# Patient Record
Sex: Male | Born: 1990 | Race: White | Hispanic: No | Marital: Married | Smoking: Former smoker
Health system: Southern US, Community
[De-identification: ages and names within clinical notes are randomized; demographics above are authoritative.]

---

## 2019-04-17 DIAGNOSIS — R3129 Other microscopic hematuria: Secondary | ICD-10-CM | POA: Diagnosis not present

## 2019-04-17 DIAGNOSIS — K5900 Constipation, unspecified: Secondary | ICD-10-CM | POA: Diagnosis not present

## 2019-04-17 DIAGNOSIS — R399 Unspecified symptoms and signs involving the genitourinary system: Secondary | ICD-10-CM | POA: Diagnosis not present

## 2019-07-31 DIAGNOSIS — Z20828 Contact with and (suspected) exposure to other viral communicable diseases: Secondary | ICD-10-CM | POA: Diagnosis not present

## 2019-07-31 DIAGNOSIS — Z20822 Contact with and (suspected) exposure to covid-19: Secondary | ICD-10-CM | POA: Diagnosis not present

## 2019-12-21 DIAGNOSIS — Z20828 Contact with and (suspected) exposure to other viral communicable diseases: Secondary | ICD-10-CM | POA: Diagnosis not present

## 2019-12-21 DIAGNOSIS — Z20822 Contact with and (suspected) exposure to covid-19: Secondary | ICD-10-CM | POA: Diagnosis not present

## 2020-12-24 DIAGNOSIS — S0990XA Unspecified injury of head, initial encounter: Secondary | ICD-10-CM | POA: Diagnosis not present

## 2020-12-24 DIAGNOSIS — W182XXA Fall in (into) shower or empty bathtub, initial encounter: Secondary | ICD-10-CM | POA: Diagnosis not present

## 2021-02-17 DIAGNOSIS — F909 Attention-deficit hyperactivity disorder, unspecified type: Secondary | ICD-10-CM | POA: Diagnosis not present

## 2021-02-17 DIAGNOSIS — F411 Generalized anxiety disorder: Secondary | ICD-10-CM | POA: Diagnosis not present

## 2021-03-01 DIAGNOSIS — F909 Attention-deficit hyperactivity disorder, unspecified type: Secondary | ICD-10-CM | POA: Diagnosis not present

## 2021-03-01 DIAGNOSIS — F411 Generalized anxiety disorder: Secondary | ICD-10-CM | POA: Diagnosis not present

## 2021-04-01 DIAGNOSIS — F411 Generalized anxiety disorder: Secondary | ICD-10-CM | POA: Diagnosis not present

## 2021-04-01 DIAGNOSIS — F909 Attention-deficit hyperactivity disorder, unspecified type: Secondary | ICD-10-CM | POA: Diagnosis not present

## 2021-04-28 ENCOUNTER — Encounter (HOSPITAL_COMMUNITY): Payer: Self-pay

## 2021-04-28 ENCOUNTER — Ambulatory Visit (HOSPITAL_COMMUNITY)
Admission: EM | Admit: 2021-04-28 | Discharge: 2021-04-28 | Disposition: A | Discharge status: Home / Self Care | Payer: Federal, State, Local not specified - PPO | Attending: Physician Assistant | Admitting: Physician Assistant

## 2021-04-28 ENCOUNTER — Other Ambulatory Visit: Payer: Self-pay

## 2021-04-28 DIAGNOSIS — R350 Frequency of micturition: Secondary | ICD-10-CM | POA: Diagnosis not present

## 2021-04-28 DIAGNOSIS — R000 Tachycardia, unspecified: Secondary | ICD-10-CM | POA: Insufficient documentation

## 2021-04-28 DIAGNOSIS — R39198 Other difficulties with micturition: Secondary | ICD-10-CM | POA: Diagnosis not present

## 2021-04-28 DIAGNOSIS — N390 Urinary tract infection, site not specified: Secondary | ICD-10-CM | POA: Diagnosis not present

## 2021-04-28 DIAGNOSIS — R300 Dysuria: Secondary | ICD-10-CM | POA: Insufficient documentation

## 2021-04-28 LAB — POCT URINALYSIS DIPSTICK, ED / UC
Bilirubin Urine: NEGATIVE
Glucose, UA: NEGATIVE mg/dL
Ketones, ur: NEGATIVE mg/dL
Nitrite: NEGATIVE
Protein, ur: NEGATIVE mg/dL
Specific Gravity, Urine: 1.02 (ref 1.005–1.030)
Urobilinogen, UA: 2 mg/dL — ABNORMAL HIGH (ref 0.0–1.0)
pH: 7 (ref 5.0–8.0)

## 2021-04-28 MED ORDER — SULFAMETHOXAZOLE-TRIMETHOPRIM 800-160 MG PO TABS: 1.0000 | ORAL_TABLET | Freq: Two times a day (BID) | ORAL | 0 refills | Status: AC

## 2021-04-28 NOTE — ED Provider Notes (Signed)
[facility]    CSN: [id number] Arrival date & time: 04/28/21  1924      History   Chief Complaint Chief Complaint  Patient presents with   Urinary Tract Infection    HPI Johnny Burch is a 30 y.o. male.   Patient presents today with a 4-day history of UTI symptoms.  Reports frequency, decreased initiation of stream, urgency, dysuria.  Denies any hematuria, flank pain, abdominal pain, nausea, vomiting.  He does have a history of nephrolithiasis and has seen urologist when he was much younger but has not had any issues with this condition recently.  He does report fatigue, malaise, chills, headache.  He denies any recent antibiotic use.  He is sexually active but has no concern for STI though he is open to testing.  He has not seen a urologist recently.  He has been able to attend work and perform daily activities despite symptoms.  He has not been taking any over-the-counter medication for symptom management.   History reviewed. No pertinent past medical history.  There are no problems to display for this patient.   History reviewed. No pertinent surgical history.     Home Medications    Prior to Admission medications   Medication Sig Start Date End Date Taking? Authorizing Provider  sulfamethoxazole-trimethoprim (BACTRIM DS) 800-160 MG tablet Take 1 tablet by mouth 2 (two) times daily for 7 days. 04/28/21 05/05/21 Yes Herlinda Heady, Noberto Retort, PA-C    Family History History reviewed. No pertinent family history.  Social History Social History   Tobacco Use   Smoking status: Every Day    Types: Cigarettes   Smokeless tobacco: Never  Vaping Use   Vaping Use: Never used  Substance Use Topics   Alcohol use: Never     Allergies   Patient has no known allergies.   Review of Systems Review of Systems  Constitutional:  Positive for activity change and chills. Negative for appetite change, fatigue and fever.  Respiratory:  Negative for cough and shortness of  breath.   Cardiovascular:  Negative for chest pain.  Gastrointestinal:  Negative for abdominal pain, diarrhea, nausea and vomiting.  Genitourinary:  Positive for difficulty urinating, dysuria, frequency and urgency. Negative for flank pain, penile discharge and penile pain.  Musculoskeletal:  Positive for back pain. Negative for arthralgias and myalgias.  Neurological:  Positive for headaches.    Physical Exam Triage Vital Signs ED Triage Vitals  Enc Vitals Group     BP 04/28/21 1946 (!) 135/94     Pulse Rate 04/28/21 1946 (!) 124     Resp 04/28/21 1946 19     Temp 04/28/21 1946 99.4 F (37.4 C)     Temp Source 04/28/21 1946 Oral     SpO2 04/28/21 1946 98 %     Weight --      Height --      Head Circumference --      Peak Flow --      Pain Score 04/28/21 1944 0     Pain Loc --      Pain Edu? --      Excl. in GC? --    No data found.  Updated Vital Signs BP (!) 175/96 (BP Location: Right Arm)   Pulse (!) 114   Temp 99 F (37.2 C) (Oral)   Resp 17   SpO2 97%   Visual Acuity Right Eye Distance:   Left Eye Distance:   Bilateral Distance:    Right Eye  Near:   Left Eye Near:    Bilateral Near:     Physical Exam Vitals reviewed.  Constitutional:      General: He is awake.     Appearance: Normal appearance. He is well-developed. He is not ill-appearing.     Comments: Very pleasant male appears stated age in no acute distress sitting comfortably in exam room  HENT:     Head: Normocephalic and atraumatic.     Mouth/Throat:     Pharynx: Uvula midline. No oropharyngeal exudate or posterior oropharyngeal erythema.  Cardiovascular:     Rate and Rhythm: Regular rhythm. Tachycardia present.     Heart sounds: Normal heart sounds, S1 normal and S2 normal. No murmur heard. Pulmonary:     Effort: Pulmonary effort is normal.     Breath sounds: Normal breath sounds. No stridor. No wheezing, rhonchi or rales.     Comments: Clear to auscultation bilaterally Abdominal:      General: Bowel sounds are normal.     Palpations: Abdomen is soft.     Tenderness: There is no abdominal tenderness. There is no right CVA tenderness, left CVA tenderness, guarding or rebound.     Comments: Benign abdominal exam  Neurological:     Mental Status: He is alert.  Psychiatric:        Behavior: Behavior is cooperative.     UC Treatments / Results  Labs (all labs ordered are listed, but only abnormal results are displayed) Labs Reviewed  POCT URINALYSIS DIPSTICK, ED / UC - Abnormal; Notable for the following components:      Result Value   Hgb urine dipstick MODERATE (*)    Urobilinogen, UA 2.0 (*)    Leukocytes,Ua MODERATE (*)    All other components within normal limits  URINE CULTURE  CYTOLOGY, (ORAL, ANAL, URETHRAL) ANCILLARY ONLY    EKG   Radiology No results found.  Procedures Procedures (including critical care time)  Medications Ordered in UC Medications - No data to display  Initial Impression / Assessment and Plan / UC Course  I have reviewed the triage vital signs and the nursing notes.  Pertinent labs & imaging results that were available during my care of the patient were reviewed by me and considered in my medical decision making (see chart for details).      Concern for UTI versus infected stone given clinical presentation and UA findings.  Patient was started on Bactrim DS.  Offered injection of Rocephin but he declined this due to fear of needles.  STI swab was collected-results pending.  Recommended that he rest and drink plenty of fluid.  Offered work excuse note he declined.  Discussed that he should follow-up with urology was given contact information for local provider.  Encouraged him to rest and drink plenty of fluid.  Discussed alarm symptoms that warrant emergent evaluation.  Strict return precautions given to which he expressed understanding.  Patient was known to be tachycardic on exam.  Suspect this is related to acute illness.  He  denies any chest pain, shortness of breath, palpitations, lightheadedness at this time.  Recommended that he monitor his heart rate at home and if this remains above 100 he is to be reevaluated.  Discussed alarm symptoms that warrant emergent evaluation.  Final Clinical Impressions(s) / UC Diagnoses   Final diagnoses:  Lower urinary tract infectious disease  Dysuria  Urinary frequency  Difficulty urinating  Tachycardia     Discharge Instructions      We are going  to treat you for a urinary tract infection.  Please take Bactrim DS twice daily for 7 days.  It is importantly follow-up with urologist so please call schedule an appointment.  Make sure you are drinking plenty of fluid.  If you have any worsening symptoms please return for reevaluation.  Your heart rate was high as we discussed which could be related to pain or illness.  If this remains elevated or if you develop any symptoms including lightheadedness or chest pain you need to go to the emergency room.  Someone is to recheck your pulse and reevaluate you within a few days.  If you cannot see PCP please return to our clinic for further evaluation.     ED Prescriptions     Medication Sig Dispense Auth. Provider   sulfamethoxazole-trimethoprim (BACTRIM DS) 800-160 MG tablet Take 1 tablet by mouth 2 (two) times daily for 7 days. 14 tablet Annalysa Mohammad, Noberto Retort, PA-C      PDMP not reviewed this encounter.   Jeani Hawking, PA-C 04/28/21 2100

## 2021-04-28 NOTE — ED Triage Notes (Signed)
Pt presents with c/o dysuria and states he urinates in small amounts. States it has been going on for 4 days.   States he was placed on new ADHD medicine that has caused mild hemorrhoids. States he was given the medication  months ago.

## 2021-04-28 NOTE — Discharge Instructions (Addendum)
We are going to treat you for a urinary tract infection.  Please take Bactrim DS twice daily for 7 days.  It is importantly follow-up with urologist so please call schedule an appointment.  Make sure you are drinking plenty of fluid.  If you have any worsening symptoms please return for reevaluation.  Your heart rate was high as we discussed which could be related to pain or illness.  If this remains elevated or if you develop any symptoms including lightheadedness or chest pain you need to go to the emergency room.  Someone is to recheck your pulse and reevaluate you within a few days.  If you cannot see PCP please return to our clinic for further evaluation.

## 2021-04-29 LAB — CYTOLOGY, (ORAL, ANAL, URETHRAL) ANCILLARY ONLY
Chlamydia: NEGATIVE
Comment: NEGATIVE
Comment: NEGATIVE
Comment: NORMAL
Neisseria Gonorrhea: NEGATIVE
Trichomonas: NEGATIVE

## 2021-04-29 LAB — URINE CULTURE: Culture: NO GROWTH

## 2021-05-30 DIAGNOSIS — R3912 Poor urinary stream: Secondary | ICD-10-CM | POA: Diagnosis not present

## 2021-05-30 DIAGNOSIS — N411 Chronic prostatitis: Secondary | ICD-10-CM | POA: Diagnosis not present

## 2021-05-30 DIAGNOSIS — R8271 Bacteriuria: Secondary | ICD-10-CM | POA: Diagnosis not present

## 2021-05-30 DIAGNOSIS — R3915 Urgency of urination: Secondary | ICD-10-CM | POA: Diagnosis not present

## 2021-05-30 DIAGNOSIS — R310 Gross hematuria: Secondary | ICD-10-CM | POA: Diagnosis not present

## 2021-05-30 DIAGNOSIS — R350 Frequency of micturition: Secondary | ICD-10-CM | POA: Diagnosis not present

## 2021-05-30 DIAGNOSIS — R3914 Feeling of incomplete bladder emptying: Secondary | ICD-10-CM | POA: Diagnosis not present

## 2021-08-20 DIAGNOSIS — R350 Frequency of micturition: Secondary | ICD-10-CM | POA: Diagnosis not present

## 2021-08-20 DIAGNOSIS — R32 Unspecified urinary incontinence: Secondary | ICD-10-CM | POA: Diagnosis not present

## 2021-08-20 DIAGNOSIS — R300 Dysuria: Secondary | ICD-10-CM | POA: Diagnosis not present

## 2021-08-20 DIAGNOSIS — R3129 Other microscopic hematuria: Secondary | ICD-10-CM | POA: Diagnosis not present

## 2021-09-04 ENCOUNTER — Emergency Department (HOSPITAL_COMMUNITY)
Admission: EM | Admit: 2021-09-04 | Discharge: 2021-09-04 | Disposition: A | Discharge status: Home / Self Care | Payer: Federal, State, Local not specified - PPO | Attending: Emergency Medicine | Admitting: Emergency Medicine

## 2021-09-04 ENCOUNTER — Encounter (HOSPITAL_COMMUNITY): Payer: Self-pay

## 2021-09-04 ENCOUNTER — Emergency Department (HOSPITAL_COMMUNITY): Payer: Federal, State, Local not specified - PPO

## 2021-09-04 DIAGNOSIS — N452 Orchitis: Secondary | ICD-10-CM | POA: Diagnosis not present

## 2021-09-04 DIAGNOSIS — N453 Epididymo-orchitis: Secondary | ICD-10-CM | POA: Diagnosis not present

## 2021-09-04 DIAGNOSIS — N433 Hydrocele, unspecified: Secondary | ICD-10-CM | POA: Diagnosis not present

## 2021-09-04 DIAGNOSIS — N451 Epididymitis: Secondary | ICD-10-CM | POA: Diagnosis not present

## 2021-09-04 DIAGNOSIS — N5082 Scrotal pain: Secondary | ICD-10-CM | POA: Diagnosis not present

## 2021-09-04 LAB — URINALYSIS, ROUTINE W REFLEX MICROSCOPIC
Bilirubin Urine: NEGATIVE
Glucose, UA: NEGATIVE mg/dL
Ketones, ur: NEGATIVE mg/dL
Nitrite: POSITIVE — AB
Protein, ur: NEGATIVE mg/dL
Specific Gravity, Urine: 1.024 (ref 1.005–1.030)
WBC, UA: >50 WBC/hpf — ABNORMAL HIGH (ref 0–5)
pH: 6 (ref 5.0–8.0)

## 2021-09-04 MED ORDER — LEVOFLOXACIN 500 MG PO TABS
500.0000 mg | ORAL_TABLET | Freq: Every day | ORAL | Status: DC
  Administered 2021-09-04: 500 mg via ORAL
  Filled 2021-09-04: qty 1

## 2021-09-04 MED ORDER — LEVOFLOXACIN 500 MG PO TABS: 500.0000 mg | ORAL_TABLET | Freq: Every day | ORAL | 0 refills | Status: DC

## 2021-09-04 NOTE — ED Triage Notes (Signed)
Pt presents with c/o testicle pain on the left side x 1 day. Pt also reports some swelling on that side.

## 2021-09-04 NOTE — Discharge Instructions (Addendum)
Return for any problem.  Follow-up closely with [facility].

## 2021-09-04 NOTE — ED Provider Notes (Addendum)
[facility] Provider Note   CSN: [id number] Arrival date & time: 09/04/21  5093     History  Chief Complaint  Patient presents with   Testicle Pain    Johnny Burch is a 31 y.o. male.  31 year old male with prior medical history as detailed below presents for evaluation of scrotal pain.  Patient complains of significant pain to the left testicle.  Patient reports the pain began yesterday afternoon.  He denies any specific inciting event.  He denies penile discharge.  He denies prior history of STD. He is married and in a monogamous relationship. He is low risk for STI. Patient is without fever.  He reports he took some ibuprofen for his discomfort this morning and had significant improvement in symptoms.  The history is provided by the patient and medical records.  Testicle Pain This is a new problem. The current episode started 12 to 24 hours ago. The problem occurs rarely. The problem has not changed since onset.Pertinent negatives include no chest pain and no abdominal pain. Nothing aggravates the symptoms. Nothing relieves the symptoms.      Home Medications Prior to Admission medications   Not on File      Allergies    Patient has no known allergies.    Review of Systems   Review of Systems  Cardiovascular:  Negative for chest pain.  Gastrointestinal:  Negative for abdominal pain.  Genitourinary:  Positive for testicular pain.  All other systems reviewed and are negative.  Physical Exam Updated Vital Signs BP 138/88    Pulse 96    Temp 97.9 F (36.6 C) (Oral)    Resp 16    SpO2 96%  Physical Exam Vitals and nursing note reviewed.  Constitutional:      General: He is not in acute distress.    Appearance: Normal appearance. He is well-developed.  HENT:     Head: Normocephalic and atraumatic.  Eyes:     Conjunctiva/sclera: Conjunctivae normal.     Pupils: Pupils are equal, round, and reactive to light.  Cardiovascular:      Rate and Rhythm: Normal rate and regular rhythm.     Heart sounds: Normal heart sounds.  Pulmonary:     Effort: Pulmonary effort is normal. No respiratory distress.     Breath sounds: Normal breath sounds.  Abdominal:     General: There is no distension.     Palpations: Abdomen is soft.     Tenderness: There is no abdominal tenderness.  Genitourinary:    Comments: Patient's scrotum is without edema or erythema.  There is moderate tenderness over the left epididymis.  No penile discharge noted.  No inguinal lymphadenopathy noted.  No rash or lesion on the skin noted. Musculoskeletal:        General: No deformity. Normal range of motion.     Cervical back: Normal range of motion and neck supple.  Skin:    General: Skin is warm and dry.  Neurological:     General: No focal deficit present.     Mental Status: He is alert and oriented to person, place, and time.    ED Results / Procedures / Treatments   Labs (all labs ordered are listed, but only abnormal results are displayed) Labs Reviewed  URINALYSIS, ROUTINE W REFLEX MICROSCOPIC    EKG None  Radiology US SCROTUM W/DOPPLER  Result Date: 09/04/2021 CLINICAL DATA:  Acute left-sided scrotal pain. EXAM: SCROTAL ULTRASOUND DOPPLER ULTRASOUND OF THE TESTICLES TECHNIQUE: Complete  ultrasound examination of the testicles, epididymis, and other scrotal structures was performed. Color and spectral Doppler ultrasound were also utilized to evaluate blood flow to the testicles. COMPARISON:  None. FINDINGS: Right testicle Measurements: 4.7 x 3.3 x 2.4 cm. No mass or microlithiasis visualized. Left testicle Measurements: 4.9 x 3.3 x 2.9 cm. No mass or microlithiasis visualized. The testicle is somewhat hypervascular on Doppler. Right epididymis:  Normal in size and appearance. Left epididymis:  Hypervascular suggesting epididymitis. Hydrocele:  Small bilateral hydroceles are noted. Varicocele:  None visualized. Pulsed Doppler interrogation of both  testes demonstrates normal low resistance arterial and venous waveforms bilaterally. IMPRESSION: No evidence of testicular mass or torsion. Left testicle and epididymis are hypervascular suggesting left-sided epididymo-orchitis. Small bilateral hydroceles. Electronically Signed   By: Lupita Raider M.D.   On: 09/04/2021 08:16    Procedures Procedures    Medications Ordered in ED Medications - No data to display  ED Course/ Medical Decision Making/ A&P                           Medical Decision Making Amount and/or Complexity of Data Reviewed Labs: ordered. Radiology: ordered. ECG/medicine tests: ordered.  Risk Prescription drug management.    Medical Screen Complete  This patient presented to the ED with complaint of left scrotal and testicular pain.  This complaint involves an extensive number of treatment options. The initial differential diagnosis includes, but is not limited to, epididymitis, orchitis, testicular torsion, UTI, etc.  This presentation is: Acute, Self-Limited, Previously Undiagnosed, and Uncertain Prognosis  Patient is presenting with complaint of left scrotal pain  Exam is suggestive of likely epididymitis  Patient's ED ultrasound is without evidence of torsion.  There is findings on ultrasound suggestive of epididymo orchitis.  Patient is comfortable at time of discharge.  He understands need for close follow-up with [facility].  He is already established with [facility].  Importance of close follow-up is stressed.  Strict return precautions given and understood     Additional history obtained:  External records from outside sources obtained and reviewed including prior ED visits and prior Inpatient records.    Lab Tests:  I ordered and personally interpreted labs.  The pertinent results include:  UA   Imaging Studies ordered:  I ordered imaging studies including US testicular  I independently visualized and interpreted obtained  imaging which showed epididymo orchitis I agree with the radiologist interpretation.   Medicines ordered:  I ordered medication including levaquin  for UTI  Reevaluation of the patient after these medicines showed that the patient: improved  Problem List / ED Course:  Scrotal pain   Reevaluation:  After the interventions noted above, I reevaluated the patient and found that they have: improved   Disposition:  After consideration of the diagnostic results and the patients response to treatment, I feel that the patent would benefit from close follow up with urology.          Final Clinical Impression(s) / ED Diagnoses Final diagnoses:  Epididymitis  Orchitis    Rx / DC Orders ED Discharge Orders          Ordered    levofloxacin (LEVAQUIN) 500 MG tablet  Daily        09/04/21 0859              Wynetta Fines, MD 09/04/21 0569    Wynetta Fines, MD 09/04/21 [phone]

## 2021-09-06 LAB — URINE CULTURE: Culture: >=100000 — AB

## 2021-09-07 ENCOUNTER — Telehealth (HOSPITAL_BASED_OUTPATIENT_CLINIC_OR_DEPARTMENT_OTHER): Payer: Self-pay | Admitting: *Deleted

## 2021-09-07 NOTE — Telephone Encounter (Signed)
Post ED Visit - Positive Culture Follow-up  Culture report reviewed by antimicrobial stewardship pharmacist: [place] Team []  Elenor Quinones, Pharm.D. []  Heide Guile, Pharm.D., BCPS AQ-ID []  Parks Neptune, Pharm.D., BCPS []  Alycia Rossetti, Pharm.D., BCPS []  [place], Pharm.D., BCPS, AAHIVP []  Legrand Como, Pharm.D., BCPS, AAHIVP []  Salome Arnt, PharmD, BCPS []  Johnnette Gourd, PharmD, BCPS []  Hughes Better, PharmD, BCPS []  Leeroy Cha, PharmD []  Laqueta Linden, PharmD, BCPS []  Albertina Parr, PharmD  [place] Team []  Leodis Sias, PharmD []  Lindell Spar, PharmD []  Royetta Asal, PharmD []  Graylin Shiver, Rph []  Rema Fendt) Glennon Mac, PharmD []  Arlyn Dunning, PharmD []  Netta Cedars, PharmD []  Dia Sitter, PharmD []  Leone Haven, PharmD []  Gretta Arab, PharmD []  Theodis Shove, PharmD []  Peggyann Juba, PharmD [x]  Ulice Dash, PharmD   Positive urine culture Treated with Levofloxacin, organism sensitive to the same and no further patient follow-up is required at this time.  Rosie Fate 09/07/2021, 8:45 AM

## 2021-09-11 DIAGNOSIS — R3129 Other microscopic hematuria: Secondary | ICD-10-CM | POA: Diagnosis not present

## 2021-11-15 DIAGNOSIS — R300 Dysuria: Secondary | ICD-10-CM | POA: Diagnosis not present

## 2021-11-15 DIAGNOSIS — N410 Acute prostatitis: Secondary | ICD-10-CM | POA: Diagnosis not present

## 2021-12-14 ENCOUNTER — Emergency Department (HOSPITAL_COMMUNITY)
Admission: EM | Admit: 2021-12-14 | Discharge: 2021-12-14 | Disposition: A | Discharge status: Home / Self Care | Payer: Federal, State, Local not specified - PPO | Attending: Student | Admitting: Student

## 2021-12-14 ENCOUNTER — Emergency Department (HOSPITAL_COMMUNITY): Payer: Federal, State, Local not specified - PPO

## 2021-12-14 ENCOUNTER — Encounter (HOSPITAL_COMMUNITY): Payer: Self-pay

## 2021-12-14 ENCOUNTER — Other Ambulatory Visit: Payer: Self-pay

## 2021-12-14 DIAGNOSIS — N452 Orchitis: Secondary | ICD-10-CM | POA: Diagnosis not present

## 2021-12-14 DIAGNOSIS — N5089 Other specified disorders of the male genital organs: Secondary | ICD-10-CM | POA: Diagnosis not present

## 2021-12-14 DIAGNOSIS — N453 Epididymo-orchitis: Secondary | ICD-10-CM | POA: Diagnosis not present

## 2021-12-14 DIAGNOSIS — R300 Dysuria: Secondary | ICD-10-CM | POA: Insufficient documentation

## 2021-12-14 DIAGNOSIS — N4889 Other specified disorders of penis: Secondary | ICD-10-CM | POA: Diagnosis not present

## 2021-12-14 DIAGNOSIS — N50819 Testicular pain, unspecified: Secondary | ICD-10-CM | POA: Diagnosis not present

## 2021-12-14 LAB — URINALYSIS, ROUTINE W REFLEX MICROSCOPIC
Bacteria, UA: NONE SEEN
Bilirubin Urine: NEGATIVE
Glucose, UA: NEGATIVE mg/dL
Ketones, ur: NEGATIVE mg/dL
Leukocytes,Ua: NEGATIVE
Nitrite: POSITIVE — AB
Protein, ur: NEGATIVE mg/dL
Specific Gravity, Urine: 1.013 (ref 1.005–1.030)
pH: 5 (ref 5.0–8.0)

## 2021-12-14 MED ORDER — IBUPROFEN 200 MG PO TABS
600.0000 mg | ORAL_TABLET | Freq: Once | ORAL | Status: AC
  Administered 2021-12-14: 600 mg via ORAL
  Filled 2021-12-14: qty 3

## 2021-12-14 MED ORDER — KETOROLAC TROMETHAMINE 15 MG/ML IJ SOLN
15.0000 mg | Freq: Once | INTRAMUSCULAR | Status: DC
  Filled 2021-12-14: qty 1

## 2021-12-14 MED ORDER — LEVOFLOXACIN 500 MG PO TABS
500.0000 mg | ORAL_TABLET | Freq: Once | ORAL | Status: AC
  Administered 2021-12-14: 500 mg via ORAL
  Filled 2021-12-14: qty 1

## 2021-12-14 MED ORDER — LEVOFLOXACIN 500 MG PO TABS: 500.0000 mg | ORAL_TABLET | Freq: Every day | ORAL | 0 refills | Status: AC

## 2021-12-14 NOTE — ED Provider Notes (Signed)
[facility] Provider Note   CSN: [id number] Arrival date & time: 12/14/21  1938     History  Chief Complaint  Patient presents with   Penis Pain    Johnny Burch is a 31 y.o. male.  31 year old male presents today for evaluation of dysuria, pain over the tract that leads his penis down to his scrotum.  He states he has intermittently dealt with this now for 9 months.  Previously had ultrasounds done which showed epididymitis, and orchitis.  He has been following with urologist for this.  States he recently had a CT scan done and is waiting to hear back on the results.  He denies fever, chills.  He denies penile discharge.  In addition to the dysuria he endorses urinary frequency, urgency, and accidents.  This is primarily been over the past 4 days.  States he has taken Azo which was prescribed by the urologist with improvement in symptoms along with ibuprofen.  The history is provided by the patient. No language interpreter was used.      Home Medications Prior to Admission medications   Medication Sig Start Date End Date Taking? Authorizing Provider  levofloxacin (LEVAQUIN) 500 MG tablet Take 1 tablet (500 mg total) by mouth daily. 09/04/21   Valarie Merino, MD      Allergies    Patient has no known allergies.    Review of Systems   Review of Systems  Constitutional:  Negative for chills and fever.  Genitourinary:  Positive for dysuria, frequency and penile pain. Negative for difficulty urinating, flank pain, penile discharge, penile swelling, scrotal swelling and testicular pain.  All other systems reviewed and are negative.  Physical Exam Updated Vital Signs BP (!) 164/89 (BP Location: Right Arm)   Pulse (!) 103   Temp 98.4 F (36.9 C) (Oral)   Resp 18   Ht 6' (1.829 m)   Wt 93 kg   SpO2 94%   BMI 27.80 kg/m  Physical Exam Vitals and nursing note reviewed. Exam conducted with a chaperone present.  Constitutional:       General: He is not in acute distress.    Appearance: Normal appearance. He is not ill-appearing.  HENT:     Head: Normocephalic and atraumatic.     Nose: Nose normal.  Eyes:     Conjunctiva/sclera: Conjunctivae normal.  Cardiovascular:     Rate and Rhythm: Normal rate and regular rhythm.  Pulmonary:     Effort: Pulmonary effort is normal. No respiratory distress.  Abdominal:     Hernia: There is no hernia in the left inguinal area or right inguinal area.  Genitourinary:    Pubic Area: No rash.      Penis: Uncircumcised. No tenderness or swelling.      Testes: Normal. Cremasteric reflex is present.        Right: Swelling not present. Right testis is descended. Cremasteric reflex is present.         Left: Swelling not present. Left testis is descended. Cremasteric reflex is present.   Musculoskeletal:        General: No deformity.  Lymphadenopathy:     Lower Body: No right inguinal adenopathy. No left inguinal adenopathy.  Skin:    Findings: No rash.  Neurological:     Mental Status: He is alert.    ED Results / Procedures / Treatments   Labs (all labs ordered are listed, but only abnormal results are displayed) Labs Reviewed  URINALYSIS, ROUTINE  W REFLEX MICROSCOPIC    EKG None  Radiology No results found.  Procedures Procedures    Medications Ordered in ED Medications - No data to display  ED Course/ Medical Decision Making/ A&P                           Medical Decision Making Amount and/or Complexity of Data Reviewed Labs: ordered. Radiology: ordered.  Risk OTC drugs. Prescription drug management.   31 year old male presents today for evaluation of dysuria, pain in the left side of his scrotum leading up to the penis.  Denies swelling of the testes, or scrotum.  Denies fever.  UA without evidence of UTI although he is nitrite positive but this is probably secondary to Azo..  Low concern for testicular torsion.  However will evaluate with ultrasound.   Ultrasound with evidence of epididymitis and orchitis.  Will treat with levofloxacin.  Initial dose provided in the emergency room.  Patient had a CT scan done recently as part of this work-up by urology.  He is unsure of the results at this time.  He follows with [facility].  I do not have access to CT results.  Discussed importance of follow-up with urology.  Return precautions discussed.  Patient voices understanding and is in agreement with plan.   Final Clinical Impression(s) / ED Diagnoses Final diagnoses:  Orchitis and epididymitis    Rx / DC Orders ED Discharge Orders          Ordered    levofloxacin (LEVAQUIN) 500 MG tablet  Daily        12/14/21 2157              Evlyn Courier, PA-C 12/14/21 2201    Kommor, Debe Coder, MD 12/15/21 1740

## 2021-12-14 NOTE — ED Triage Notes (Signed)
Pt presents to ED from home with c/o of pain between the scrotum and penis area. Pt states pain has been ongoing for 9 months but worsened in the last 4 days. Pt endorses urinating frequent small amounts and losing control of his bladder occasionally.

## 2021-12-14 NOTE — Discharge Instructions (Addendum)
Your work-up today was significant for epididymitis and orchitis.  You received your first dose of antibiotic in the emergency room.  I have sent additional antibiotics into the pharmacy for you.  If you have any worsening symptoms return to the emergency room otherwise follow-up with your urologist.  Follow-up on your recent CT scan you had done by urology.

## 2021-12-14 NOTE — ED Notes (Signed)
Pt states he would prefer to have blood drawn after he is able to see the doctor. Will hold off on blood draw in triage.

## 2021-12-24 DIAGNOSIS — N3941 Urge incontinence: Secondary | ICD-10-CM | POA: Diagnosis not present

## 2021-12-24 DIAGNOSIS — Z0000 Encounter for general adult medical examination without abnormal findings: Secondary | ICD-10-CM | POA: Diagnosis not present

## 2021-12-24 DIAGNOSIS — R350 Frequency of micturition: Secondary | ICD-10-CM | POA: Diagnosis not present

## 2021-12-24 DIAGNOSIS — R5383 Other fatigue: Secondary | ICD-10-CM | POA: Diagnosis not present

## 2021-12-24 DIAGNOSIS — Z131 Encounter for screening for diabetes mellitus: Secondary | ICD-10-CM | POA: Diagnosis not present

## 2021-12-24 DIAGNOSIS — F40243 Fear of flying: Secondary | ICD-10-CM | POA: Diagnosis not present

## 2021-12-24 DIAGNOSIS — E559 Vitamin D deficiency, unspecified: Secondary | ICD-10-CM | POA: Diagnosis not present

## 2021-12-24 DIAGNOSIS — Z1159 Encounter for screening for other viral diseases: Secondary | ICD-10-CM | POA: Diagnosis not present

## 2021-12-24 DIAGNOSIS — Z1339 Encounter for screening examination for other mental health and behavioral disorders: Secondary | ICD-10-CM | POA: Diagnosis not present

## 2022-01-12 DIAGNOSIS — R030 Elevated blood-pressure reading, without diagnosis of hypertension: Secondary | ICD-10-CM | POA: Diagnosis not present

## 2022-01-12 DIAGNOSIS — E785 Hyperlipidemia, unspecified: Secondary | ICD-10-CM | POA: Diagnosis not present

## 2022-01-12 DIAGNOSIS — E559 Vitamin D deficiency, unspecified: Secondary | ICD-10-CM | POA: Diagnosis not present

## 2022-01-12 DIAGNOSIS — Z6828 Body mass index (BMI) 28.0-28.9, adult: Secondary | ICD-10-CM | POA: Diagnosis not present

## 2022-01-12 DIAGNOSIS — F419 Anxiety disorder, unspecified: Secondary | ICD-10-CM | POA: Diagnosis not present

## 2022-05-19 ENCOUNTER — Emergency Department (HOSPITAL_COMMUNITY): Payer: Federal, State, Local not specified - PPO

## 2022-05-19 ENCOUNTER — Ambulatory Visit (HOSPITAL_COMMUNITY)
Admission: EM | Admit: 2022-05-19 | Discharge: 2022-05-19 | Disposition: A | Discharge status: Home / Self Care | Payer: Federal, State, Local not specified - PPO | Attending: Emergency Medicine | Admitting: Emergency Medicine

## 2022-05-19 ENCOUNTER — Encounter (HOSPITAL_COMMUNITY)
Admission: EM | Disposition: A | Discharge status: Home / Self Care | Payer: Self-pay | Source: Home / Self Care | Attending: Emergency Medicine

## 2022-05-19 ENCOUNTER — Emergency Department (HOSPITAL_COMMUNITY): Payer: Federal, State, Local not specified - PPO | Admitting: Anesthesiology

## 2022-05-19 ENCOUNTER — Encounter (HOSPITAL_COMMUNITY): Payer: Self-pay | Admitting: Emergency Medicine

## 2022-05-19 ENCOUNTER — Other Ambulatory Visit: Payer: Self-pay | Admitting: Urology

## 2022-05-19 DIAGNOSIS — R339 Retention of urine, unspecified: Secondary | ICD-10-CM | POA: Diagnosis not present

## 2022-05-19 DIAGNOSIS — N3001 Acute cystitis with hematuria: Secondary | ICD-10-CM | POA: Diagnosis not present

## 2022-05-19 DIAGNOSIS — N35919 Unspecified urethral stricture, male, unspecified site: Secondary | ICD-10-CM | POA: Diagnosis not present

## 2022-05-19 DIAGNOSIS — N132 Hydronephrosis with renal and ureteral calculous obstruction: Secondary | ICD-10-CM | POA: Diagnosis not present

## 2022-05-19 DIAGNOSIS — N136 Pyonephrosis: Secondary | ICD-10-CM | POA: Diagnosis not present

## 2022-05-19 DIAGNOSIS — R000 Tachycardia, unspecified: Secondary | ICD-10-CM | POA: Diagnosis not present

## 2022-05-19 DIAGNOSIS — Z87891 Personal history of nicotine dependence: Secondary | ICD-10-CM | POA: Diagnosis not present

## 2022-05-19 DIAGNOSIS — R109 Unspecified abdominal pain: Secondary | ICD-10-CM | POA: Diagnosis not present

## 2022-05-19 DIAGNOSIS — R338 Other retention of urine: Secondary | ICD-10-CM | POA: Insufficient documentation

## 2022-05-19 DIAGNOSIS — N3289 Other specified disorders of bladder: Secondary | ICD-10-CM | POA: Diagnosis not present

## 2022-05-19 DIAGNOSIS — N1339 Other hydronephrosis: Secondary | ICD-10-CM

## 2022-05-19 DIAGNOSIS — N210 Calculus in bladder: Secondary | ICD-10-CM | POA: Diagnosis not present

## 2022-05-19 DIAGNOSIS — N200 Calculus of kidney: Secondary | ICD-10-CM | POA: Diagnosis not present

## 2022-05-19 DIAGNOSIS — N211 Calculus in urethra: Secondary | ICD-10-CM

## 2022-05-19 DIAGNOSIS — R52 Pain, unspecified: Secondary | ICD-10-CM | POA: Diagnosis not present

## 2022-05-19 DIAGNOSIS — I959 Hypotension, unspecified: Secondary | ICD-10-CM | POA: Diagnosis not present

## 2022-05-19 HISTORY — PX: CYSTOSCOPY WITH URETHRAL DILATATION: SHX5125

## 2022-05-19 LAB — BASIC METABOLIC PANEL
Anion gap: 10 (ref 5–15)
BUN: 21 mg/dL — ABNORMAL HIGH (ref 6–20)
CO2: 23 mmol/L (ref 22–32)
Calcium: 8.8 mg/dL — ABNORMAL LOW (ref 8.9–10.3)
Chloride: 104 mmol/L (ref 98–111)
Creatinine, Ser: 0.97 mg/dL (ref 0.61–1.24)
GFR, Estimated: >60 mL/min (ref >60)
Glucose, Bld: 93 mg/dL (ref 70–99)
Potassium: 3.5 mmol/L (ref 3.5–5.1)
Sodium: 137 mmol/L (ref 135–145)

## 2022-05-19 LAB — URINALYSIS, ROUTINE W REFLEX MICROSCOPIC
Bilirubin Urine: NEGATIVE
Glucose, UA: NEGATIVE mg/dL
Ketones, ur: NEGATIVE mg/dL
Nitrite: POSITIVE — AB
Protein, ur: 100 mg/dL — AB
Specific Gravity, Urine: 1.018 (ref 1.005–1.030)
pH: 8 (ref 5.0–8.0)

## 2022-05-19 LAB — CBC WITH DIFFERENTIAL/PLATELET
Abs Immature Granulocytes: 0.05 10*3/uL (ref 0.00–0.07)
Basophils Absolute: 0 10*3/uL (ref 0.0–0.1)
Basophils Relative: 0 %
Eosinophils Absolute: 0 10*3/uL (ref 0.0–0.5)
Eosinophils Relative: 0 %
HCT: 42.7 % (ref 39.0–52.0)
Hemoglobin: 14.1 g/dL (ref 13.0–17.0)
Immature Granulocytes: 0 %
Lymphocytes Relative: 2 %
Lymphs Abs: 0.3 10*3/uL — ABNORMAL LOW (ref 0.7–4.0)
MCH: 31.3 pg (ref 26.0–34.0)
MCHC: 33 g/dL (ref 30.0–36.0)
MCV: 94.9 fL (ref 80.0–100.0)
Monocytes Absolute: 0.2 10*3/uL (ref 0.1–1.0)
Monocytes Relative: 1 %
Neutro Abs: 14.4 10*3/uL — ABNORMAL HIGH (ref 1.7–7.7)
Neutrophils Relative %: 97 %
Platelets: 256 10*3/uL (ref 150–400)
RBC: 4.5 MIL/uL (ref 4.22–5.81)
RDW: 12.5 % (ref 11.5–15.5)
WBC: 15.1 10*3/uL — ABNORMAL HIGH (ref 4.0–10.5)
nRBC: 0 % (ref 0.0–0.2)

## 2022-05-19 LAB — LACTIC ACID, PLASMA: Lactic Acid, Venous: 1.3 mmol/L (ref 0.5–1.9)

## 2022-05-19 SURGERY — CYSTOSCOPY, WITH URETHRAL DILATION
Anesthesia: General

## 2022-05-19 MED ORDER — ACETAMINOPHEN 10 MG/ML IV SOLN
INTRAVENOUS | Status: AC
  Filled 2022-05-19: qty 100

## 2022-05-19 MED ORDER — OXYCODONE HCL 5 MG PO TABS: 5.0000 mg | ORAL_TABLET | Freq: Once | ORAL | 0 refills | Status: AC | PRN

## 2022-05-19 MED ORDER — ONDANSETRON HCL 4 MG/2ML IJ SOLN
INTRAMUSCULAR | Status: AC
  Filled 2022-05-19: qty 2

## 2022-05-19 MED ORDER — HYDROMORPHONE HCL 1 MG/ML IJ SOLN
INTRAMUSCULAR | Status: AC
  Filled 2022-05-19: qty 1

## 2022-05-19 MED ORDER — SODIUM CHLORIDE 0.9 % IR SOLN
Status: DC | PRN
  Administered 2022-05-19: 3000 mL

## 2022-05-19 MED ORDER — PROPOFOL 10 MG/ML IV BOLUS
INTRAVENOUS | Status: AC
  Filled 2022-05-19: qty 20

## 2022-05-19 MED ORDER — LIDOCAINE 2% (20 MG/ML) 5 ML SYRINGE
INTRAMUSCULAR | Status: DC | PRN
  Administered 2022-05-19: 80 mg via INTRAVENOUS

## 2022-05-19 MED ORDER — OXYCODONE HCL 5 MG/5ML PO SOLN: 5.0000 mg | Freq: Once | ORAL | Status: AC | PRN

## 2022-05-19 MED ORDER — IOHEXOL 300 MG/ML  SOLN
INTRAMUSCULAR | Status: DC | PRN
  Administered 2022-05-19: 20 mL via URETHRAL

## 2022-05-19 MED ORDER — OXYCODONE HCL 5 MG PO TABS
ORAL_TABLET | ORAL | Status: AC
  Filled 2022-05-19: qty 1

## 2022-05-19 MED ORDER — LACTATED RINGERS IV SOLN: INTRAVENOUS | Status: DC

## 2022-05-19 MED ORDER — ACETAMINOPHEN 10 MG/ML IV SOLN
1000.0000 mg | Freq: Once | INTRAVENOUS | Status: DC | PRN
  Administered 2022-05-19: 1000 mg via INTRAVENOUS

## 2022-05-19 MED ORDER — ONDANSETRON HCL 4 MG/2ML IJ SOLN
4.0000 mg | Freq: Once | INTRAMUSCULAR | Status: AC | PRN
  Administered 2022-05-19: 4 mg via INTRAVENOUS

## 2022-05-19 MED ORDER — PROPOFOL 500 MG/50ML IV EMUL
INTRAVENOUS | Status: DC | PRN
  Administered 2022-05-19: 180 mg via INTRAVENOUS

## 2022-05-19 MED ORDER — HYDROMORPHONE HCL 1 MG/ML IJ SOLN
0.2500 mg | INTRAMUSCULAR | Status: DC | PRN
  Administered 2022-05-19: 0.25 mg via INTRAVENOUS

## 2022-05-19 MED ORDER — STERILE WATER FOR IRRIGATION IR SOLN
Status: DC | PRN
  Administered 2022-05-19: 1000 mL

## 2022-05-19 MED ORDER — FENTANYL CITRATE (PF) 100 MCG/2ML IJ SOLN
INTRAMUSCULAR | Status: DC | PRN
  Administered 2022-05-19: 75 ug via INTRAVENOUS
  Administered 2022-05-19: 25 ug via INTRAVENOUS

## 2022-05-19 MED ORDER — FENTANYL CITRATE (PF) 100 MCG/2ML IJ SOLN
INTRAMUSCULAR | Status: AC
  Filled 2022-05-19: qty 2

## 2022-05-19 MED ORDER — MIDAZOLAM HCL 2 MG/2ML IJ SOLN
INTRAMUSCULAR | Status: AC
  Filled 2022-05-19: qty 2

## 2022-05-19 MED ORDER — HYDROMORPHONE HCL 1 MG/ML IJ SOLN
1.0000 mg | Freq: Once | INTRAMUSCULAR | Status: AC
  Administered 2022-05-19: 1 mg via INTRAVENOUS
  Filled 2022-05-19: qty 1

## 2022-05-19 MED ORDER — PHENYLEPHRINE 80 MCG/ML (10ML) SYRINGE FOR IV PUSH (FOR BLOOD PRESSURE SUPPORT)
PREFILLED_SYRINGE | INTRAVENOUS | Status: DC | PRN
  Administered 2022-05-19: 80 ug via INTRAVENOUS

## 2022-05-19 MED ORDER — CHLORHEXIDINE GLUCONATE 0.12 % MT SOLN
15.0000 mL | Freq: Once | OROMUCOSAL | Status: AC
  Administered 2022-05-19: 15 mL via OROMUCOSAL

## 2022-05-19 MED ORDER — MIDAZOLAM HCL 2 MG/2ML IJ SOLN
INTRAMUSCULAR | Status: DC | PRN
  Administered 2022-05-19: 2 mg via INTRAVENOUS

## 2022-05-19 MED ORDER — OXYCODONE HCL 5 MG PO TABS
5.0000 mg | ORAL_TABLET | Freq: Once | ORAL | Status: AC | PRN
  Administered 2022-05-19: 5 mg via ORAL

## 2022-05-19 MED ORDER — DEXMEDETOMIDINE HCL IN NACL 80 MCG/20ML IV SOLN
INTRAVENOUS | Status: DC | PRN
  Administered 2022-05-19: 12 ug via BUCCAL

## 2022-05-19 MED ORDER — ONDANSETRON HCL 4 MG/2ML IJ SOLN
INTRAMUSCULAR | Status: DC | PRN
  Administered 2022-05-19: 4 mg via INTRAVENOUS

## 2022-05-19 MED ORDER — DEXAMETHASONE SODIUM PHOSPHATE 10 MG/ML IJ SOLN
INTRAMUSCULAR | Status: DC | PRN
  Administered 2022-05-19: 10 mg via INTRAVENOUS

## 2022-05-19 MED ORDER — SODIUM CHLORIDE 0.9 % IV SOLN
2.0000 g | Freq: Once | INTRAVENOUS | Status: AC
  Administered 2022-05-19: 2 g via INTRAVENOUS
  Filled 2022-05-19: qty 20

## 2022-05-19 MED ORDER — AMISULPRIDE (ANTIEMETIC) 5 MG/2ML IV SOLN: 10.0000 mg | Freq: Once | INTRAVENOUS | Status: DC | PRN

## 2022-05-19 SURGICAL SUPPLY — 60 items
BAG COUNTER SPONGE SURGICOUNT (BAG) IMPLANT
BAG URINE DRAIN 2000ML AR STRL (UROLOGICAL SUPPLIES) ×1 IMPLANT
BAG URINE LEG 500ML (DRAIN) IMPLANT
BAG URO CATCHER STRL LF (MISCELLANEOUS) ×1 IMPLANT
BALLN NEPHROMAX 24X8X12 (BALLOONS) ×1
BALLN NEPHROSTOMY (BALLOONS)
BALLOON NEPHROMAX 24X8X12 (BALLOONS) IMPLANT
BALLOON NEPHROSTOMY (BALLOONS) IMPLANT
BASKET LASER NITINOL 1.9FR (BASKET) IMPLANT
BASKET ZERO TIP NITINOL 2.4FR (BASKET) IMPLANT
BLADE CLIPPER SURG (BLADE) IMPLANT
BLADE SURG 15 STRL LF DISP TIS (BLADE) ×1 IMPLANT
BLADE SURG 15 STRL SS (BLADE) ×1
CATH FOLEY 2W COUNCIL 20FR 5CC (CATHETERS) IMPLANT
CATH FOLEY 2W COUNCIL 5CC 16FR (CATHETERS) IMPLANT
CATH FOLEY 2WAY SLVR  5CC 16FR (CATHETERS) ×1
CATH FOLEY 2WAY SLVR 30CC 24FR (CATHETERS) ×1 IMPLANT
CATH FOLEY 2WAY SLVR 5CC 16FR (CATHETERS) ×1 IMPLANT
CATH ROBINSON RED A/P 14FR (CATHETERS) ×1 IMPLANT
CATH SET URETHRAL DILATOR (CATHETERS) IMPLANT
CATH URET 5FR 28IN CONE TIP (BALLOONS)
CATH URET 5FR 70CM CONE TIP (BALLOONS) IMPLANT
CATH URETERAL DUAL LUMEN 10F (MISCELLANEOUS) IMPLANT
CATH URETL OPEN END 6FR 70 (CATHETERS) ×1 IMPLANT
CLOTH BEACON ORANGE TIMEOUT ST (SAFETY) ×1 IMPLANT
DERMABOND ADVANCED .7 DNX12 (GAUZE/BANDAGES/DRESSINGS) ×1 IMPLANT
DRAPE LAPAROTOMY T 102X78X121 (DRAPES) ×1 IMPLANT
ELECT REM PT RETURN 15FT ADLT (MISCELLANEOUS) IMPLANT
EXTRACTOR STONE 1.7FRX115CM (UROLOGICAL SUPPLIES) IMPLANT
GLOVE BIO SURGEON STRL SZ7.5 (GLOVE) ×1 IMPLANT
GOWN STRL REUS W/ TWL XL LVL3 (GOWN DISPOSABLE) ×3 IMPLANT
GOWN STRL REUS W/TWL XL LVL3 (GOWN DISPOSABLE) ×3
GUIDEWIRE ANG ZIPWIRE 038X150 (WIRE) IMPLANT
GUIDEWIRE STR DUAL SENSOR (WIRE) ×1 IMPLANT
KIT BASIN OR (CUSTOM PROCEDURE TRAY) ×1 IMPLANT
KIT TURNOVER KIT A (KITS) IMPLANT
LASER FIB FLEXIVA PULSE ID 365 (Laser) IMPLANT
MANIFOLD NEPTUNE II (INSTRUMENTS) ×1 IMPLANT
NEEDLE HYPO 22GX1.5 SAFETY (NEEDLE) IMPLANT
NS IRRIG 1000ML POUR BTL (IV SOLUTION) IMPLANT
PACK CYSTO (CUSTOM PROCEDURE TRAY) ×1 IMPLANT
PACK GENERAL/GYN (CUSTOM PROCEDURE TRAY) ×1 IMPLANT
PENCIL SMOKE EVACUATOR (MISCELLANEOUS) IMPLANT
PLUG CATH AND CAP STER (CATHETERS) ×1 IMPLANT
SHEATH NAVIGATOR HD 11/13X28 (SHEATH) IMPLANT
SHEATH NAVIGATOR HD 11/13X36 (SHEATH) IMPLANT
SPONGE DRAIN TRACH 4X4 STRL 2S (GAUZE/BANDAGES/DRESSINGS) ×2 IMPLANT
SUT MNCRL AB 4-0 PS2 18 (SUTURE) ×1 IMPLANT
SUT SILK 2 0 30  PSL (SUTURE) ×1
SUT SILK 2 0 30 PSL (SUTURE) ×1 IMPLANT
SUT VIC AB 2-0 SH 27 (SUTURE) ×4
SUT VIC AB 2-0 SH 27X BRD (SUTURE) ×4 IMPLANT
SYR 30ML LL (SYRINGE) ×1 IMPLANT
SYR CONTROL 10ML LL (SYRINGE) IMPLANT
TOWEL OR 17X26 10 PK STRL BLUE (TOWEL DISPOSABLE) ×2 IMPLANT
TRACTIP FLEXIVA PULS ID 200XHI (Laser) IMPLANT
TRACTIP FLEXIVA PULSE ID 200 (Laser)
TUBING CONNECTING 10 (TUBING) ×1 IMPLANT
TUBING UROLOGY SET (TUBING) ×1 IMPLANT
WATER STERILE IRR 3000ML UROMA (IV SOLUTION) ×1 IMPLANT

## 2022-05-19 NOTE — H&P (Signed)
H&P Physician requesting consult: Johnny Burch  Chief Complaint: Urinary retention  History of Present Illness: 31 year old male presented with urinary retention.  For about the past year, he has had some intermittent difficulties with voiding.  He states that he did have retention when he was 67 or 14.  Does not remember exactly what was done but states he was told he had some calcifications.  CT scan performed here showed some prostatic calcifications, possible prostatic stone.  He is having significant abdominal discomfort from distended bladder.  CT also showed bilateral hydronephrosis.  History reviewed. No pertinent past medical history. History reviewed. No pertinent surgical history.  Home Medications:  Medications Prior to Admission  Medication Sig Dispense Refill Last Dose   ibuprofen (ADVIL) 200 MG tablet Take 200-600 mg by mouth every 6 (six) hours as needed for mild pain.   05/16/2022   Multiple Vitamin (MULTIVITAMIN) capsule Take 1 capsule by mouth daily.   05/18/2022   Allergies: No Known Allergies  History reviewed. No pertinent family history. Social History:  reports that he has quit smoking. His smoking use included cigarettes. He has never used smokeless tobacco. He reports that he does not drink alcohol and does not use drugs.  ROS: A complete review of systems was performed.  All systems are negative except for pertinent findings as noted. ROS   Physical Exam:  Vital signs in last 24 hours: Temp:  [98.1 F (36.7 C)-98.5 F (36.9 C)] 98.1 F (36.7 C) (10/24 1425) Pulse Rate:  [104-156] 106 (10/24 1425) Resp:  [17-34] 18 (10/24 1425) BP: (103-158)/(65-117) 127/80 (10/24 1425) SpO2:  [92 %-100 %] 95 % (10/24 1425) Weight:  [88.5 kg] 88.5 kg (10/24 1415) General:  Alert and oriented, No acute distress HEENT: Normocephalic, atraumatic Neck: No JVD or lymphadenopathy Cardiovascular: Regular rate and rhythm Lungs: Regular rate and effort Abdomen: Soft,  nontender, nondistended, no abdominal masses Back: No CVA tenderness Extremities: No edema Neurologic: Grossly intact  Laboratory Data:  Results for orders placed or performed during the hospital encounter of 05/19/22 (from the past 24 hour(s))  Basic metabolic panel     Status: Abnormal   Collection Time: 05/19/22 10:54 AM  Result Value Ref Range   Sodium 137 135 - 145 mmol/L   Potassium 3.5 3.5 - 5.1 mmol/L   Chloride 104 98 - 111 mmol/L   CO2 23 22 - 32 mmol/L   Glucose, Bld 93 70 - 99 mg/dL   BUN 21 (H) 6 - 20 mg/dL   Creatinine, Ser 0.97 0.61 - 1.24 mg/dL   Calcium 8.8 (L) 8.9 - 10.3 mg/dL   GFR, Estimated >60 >60 mL/min   Anion gap 10 5 - 15  CBC with Differential     Status: Abnormal   Collection Time: 05/19/22 10:54 AM  Result Value Ref Range   WBC 15.1 (H) 4.0 - 10.5 K/uL   RBC 4.50 4.22 - 5.81 MIL/uL   Hemoglobin 14.1 13.0 - 17.0 g/dL   HCT 42.7 39.0 - 52.0 %   MCV 94.9 80.0 - 100.0 fL   MCH 31.3 26.0 - 34.0 pg   MCHC 33.0 30.0 - 36.0 g/dL   RDW 12.5 11.5 - 15.5 %   Platelets 256 150 - 400 K/uL   nRBC 0.0 0.0 - 0.2 %   Neutrophils Relative % 97 %   Neutro Abs 14.4 (H) 1.7 - 7.7 K/uL   Lymphocytes Relative 2 %   Lymphs Abs 0.3 (L) 0.7 - 4.0 K/uL   Monocytes  Relative 1 %   Monocytes Absolute 0.2 0.1 - 1.0 K/uL   Eosinophils Relative 0 %   Eosinophils Absolute 0.0 0.0 - 0.5 K/uL   Basophils Relative 0 %   Basophils Absolute 0.0 0.0 - 0.1 K/uL   Immature Granulocytes 0 %   Abs Immature Granulocytes 0.05 0.00 - 0.07 K/uL  Lactic acid, plasma     Status: None   Collection Time: 05/19/22 12:55 PM  Result Value Ref Range   Lactic Acid, Venous 1.3 0.5 - 1.9 mmol/L  Urinalysis, Routine w reflex microscopic     Status: Abnormal   Collection Time: 05/19/22  1:20 PM  Result Value Ref Range   Color, Urine AMBER (A) YELLOW   APPearance HAZY (A) CLEAR   Specific Gravity, Urine 1.018 1.005 - 1.030   pH 8.0 5.0 - 8.0   Glucose, UA NEGATIVE NEGATIVE mg/dL   Hgb urine  dipstick SMALL (A) NEGATIVE   Bilirubin Urine NEGATIVE NEGATIVE   Ketones, ur NEGATIVE NEGATIVE mg/dL   Protein, ur 258 (A) NEGATIVE mg/dL   Nitrite POSITIVE (A) NEGATIVE   Leukocytes,Ua MODERATE (A) NEGATIVE   RBC / HPF 21-50 0 - 5 RBC/hpf   WBC, UA 21-50 0 - 5 WBC/hpf   Bacteria, UA RARE (A) NONE SEEN   No results found for this or any previous visit (from the past 240 hour(s)). Creatinine: Recent Labs    05/19/22 1054  CREATININE 0.97    Impression/Assessment:  Urinary retention Bladder/prostatic calcification  Plan:  Proceed with emergent cystoscopy with possible urethral dilation, possible cystoscopy lithotripsy, possible suprapubic tube placement, possible optilume urethral dilation  Ray Church, III 05/19/2022, 3:12 PM

## 2022-05-19 NOTE — ED Triage Notes (Signed)
Pt BIB EMS from home, c/o reported kidney stone. Per EMS, the last time this occurred pt was intoxicated in [place]. Pt hasn't voided in 16 hours. 100 mcg Fentanyl through EMS 24 gauge LAC  BP 114/80 P 70 spO2 96% RA

## 2022-05-19 NOTE — Anesthesia Preprocedure Evaluation (Signed)
Anesthesia Evaluation  Patient identified by MRN, date of birth, ID band Patient awake    Reviewed: Allergy & Precautions, NPO status , Patient's Chart, lab work & pertinent test results  Airway Mallampati: II  TM Distance: >3 FB Neck ROM: Full    Dental no notable dental hx. (+) Partial Lower, Loose   Pulmonary Current SmokerPatient did not abstain from smoking.,    Pulmonary exam normal breath sounds clear to auscultation       Cardiovascular negative cardio ROS Normal cardiovascular exam Rhythm:Regular Rate:Normal     Neuro/Psych negative neurological ROS     GI/Hepatic negative GI ROS, Neg liver ROS,   Endo/Other  negative endocrine ROS  Renal/GU negative Renal ROSLab Results      Component                Value               Date                      CREATININE               0.97                05/19/2022                BUN                      21 (H)              05/19/2022                NA                       137                 05/19/2022                K                        3.5                 05/19/2022                CL                       104                 05/19/2022                CO2                      23                  05/19/2022                Musculoskeletal   Abdominal   Peds  Hematology Lab Results      Component                Value               Date                      WBC                      15.1 (H)  05/19/2022                HGB                      14.1                05/19/2022                HCT                      42.7                05/19/2022                MCV                      94.9                05/19/2022                PLT                      256                 05/19/2022              Anesthesia Other Findings   Reproductive/Obstetrics                           Anesthesia Physical Anesthesia Plan  ASA: 2 and  emergent  Anesthesia Plan: General   Post-op Pain Management: Dilaudid IV   Induction:   PONV Risk Score and Plan: 2 and Midazolam and Ondansetron  Airway Management Planned: LMA  Additional Equipment: None  Intra-op Plan:   Post-operative Plan:   Informed Consent: I have reviewed the patients History and Physical, chart, labs and discussed the procedure including the risks, benefits and alternatives for the proposed anesthesia with the patient or authorized representative who has indicated his/her understanding and acceptance.     Dental advisory given  Plan Discussed with:   Anesthesia Plan Comments:        Anesthesia Quick Evaluation

## 2022-05-19 NOTE — Op Note (Addendum)
Operative Note  Preoperative diagnosis:  1.  Urinary retention  Postoperative diagnosis: 1.  Urinary retention 2.  Panurethral stricture  Procedure(s): 1.  Cystoscopy with urethral balloon dilation and complex Foley catheter placement over a wire  Surgeon: Link Snuffer, MD  Assistants: None  Anesthesia: General  Complications: None immediate  EBL: Minimal  Specimens: 1.  None  Drains/Catheters: 1.  16 French council tip Foley catheter  Intraoperative findings: Patient had dense panurethral narrowing.  Was ultimately able to dilate the urethra to 22 [place] with a balloon dilator.  Urethra was quite dense and difficult to dilate with urethral dilators over the wire.  Indication: 31 year old male with urinary retention.  Was found to have prostatic calcifications.  Catheter was unable to be placed in the emergency department so he presents for the previously mentioned operation.  Description of procedure:  The patient was identified and consent was obtained.  The patient was taken to the operating room and placed in the supine position.  The patient was placed under general anesthesia.  Perioperative antibiotics were administered.  The patient was placed in dorsal lithotomy.  Patient was prepped and draped in a standard sterile fashion and a timeout was performed.  I first advanced a 76 French rigid cystoscope into the distal urethra.  About 1 cm in, there was some resistance.  It looks like the urethra was about 35 [place] in caliber.  I advanced a wire through the urethra and into the bladder.  I confirmed the wire was in the bladder with fluoroscopy.  I then advanced a semirigid ureteroscope alongside the wire and navigated into the bladder and confirmed the wire was in the bladder.  Cystoscopy was limited with the semirigid ureteroscope but it appeared that he had an open prostatic urethra with high riding bladder neck.  Had a very tight bulbar and pendulous urethra.  I then used  urethral dilators and dilated over the wire from 12 [place] up to 20 [place].  The 32 French dilation was met with some resistance and the stricture was obviously quite dense.  Therefore, I advanced a balloon dilator and inflated it to 24 [place] and dilated the urethra.  I was still not able to pass a 21 [place] rigid cystoscope.  I downsized to a 59 [place] cystoscope and inspected the urethra.  There was still some tightness with a 78 French cystoscope but I was able to navigate it into the bladder.  Bladder was completely emptied.  He had a moderate amount of debris in the bladder.  There was some trabeculations.  Difficult to navigate the scope because the urethra was quite dense hindering the navigation of the scope in the bladder.  I withdrew the scope.  Entire urethra was open but still a bit tight.  I subsequently advanced a 8 [place] council tip catheter over the wire and into the bladder.  I instilled 10 cc of sterile water into the catheter balloon.  Plan: Follow-up in 1 week for catheter removal.  Likely referral to reconstructive specialist for further evaluation.  If he goes into retention again, recommend suprapubic tube with urethral rest and referral to reconstructive specialist rather than repeat urethral dilation

## 2022-05-19 NOTE — Anesthesia Procedure Notes (Signed)
Procedure Name: LMA Insertion Date/Time: 05/19/2022 3:31 PM  Performed by: Gerald Leitz, CRNAPre-anesthesia Checklist: Patient identified, Patient being monitored, Timeout performed, Emergency Drugs available and Suction available Patient Re-evaluated:Patient Re-evaluated prior to induction Oxygen Delivery Method: Circle system utilized Preoxygenation: Pre-oxygenation with 100% oxygen Induction Type: IV induction Ventilation: Mask ventilation without difficulty LMA: LMA inserted and LMA with gastric port inserted LMA Size: 4.0 Tube type: Oral Number of attempts: 1 Placement Confirmation: positive ETCO2 and breath sounds checked- equal and bilateral Tube secured with: Tape Dental Injury: Teeth and Oropharynx as per pre-operative assessment

## 2022-05-19 NOTE — Anesthesia Postprocedure Evaluation (Signed)
Anesthesia Post Note  Patient: Johnny Burch  Procedure(s) Performed: CYSTOSCOPY WITH URETHRAL DILATATION     Patient location during evaluation: PACU Anesthesia Type: General Level of consciousness: awake and alert Pain management: pain level controlled Vital Signs Assessment: post-procedure vital signs reviewed and stable Respiratory status: spontaneous breathing, nonlabored ventilation, respiratory function stable and patient connected to nasal cannula oxygen Cardiovascular status: blood pressure returned to baseline and stable Postop Assessment: no apparent nausea or vomiting Anesthetic complications: no   No notable events documented.  Last Vitals:  Vitals:   05/19/22 1715 05/19/22 1725  BP: 106/67   Pulse: (!) 103   Resp: 18   Temp:  37.5 C  SpO2: 95%     Last Pain:  Vitals:   05/19/22 1715  TempSrc:   PainSc: 0-No pain                 Barnet Glasgow

## 2022-05-19 NOTE — ED Provider Notes (Signed)
[facility] DEPT Provider Note   CSN: [id number] Arrival date & time: 05/19/22  0848     History  Chief Complaint  Patient presents with   Flank Pain    Johnny Burch is a 31 y.o. male.  HPI Patient has been unable to urinate since about 9 PM yesterday.  He reports he has a lot of pain in his suprapubic area.  He reports he is getting intense spasms.  He has some pain in the back but not really localized.  He reports he thinks his bladder is empty because a little bit of urine will dribble out from time to time.  He reports he had some problems in the past with diagnosis of prostatitis and prostate enlargement but no specific reason why.  He reports in the past he sometimes had difficulty urinating but never to this extent.  He reports he is in the worst pain he has ever had.  He has been trying to drink a lot of fluids to flush his kidneys believing that he had a kidney stone.    Home Medications Prior to Admission medications   Medication Sig Start Date End Date Taking? Authorizing Provider  ibuprofen (ADVIL) 200 MG tablet Take 200-600 mg by mouth every 6 (six) hours as needed for mild pain.   Yes [provider]  Multiple Vitamin (MULTIVITAMIN) capsule Take 1 capsule by mouth daily.   Yes [provider]      Allergies    Patient has no known allergies.    Review of Systems   Review of Systems  Physical Exam Updated Vital Signs BP 127/80   Pulse (!) 106   Temp 98.1 F (36.7 C) (Oral)   Resp 18   Ht '5\' 11"'  (1.803 m)   Wt 88.5 kg   SpO2 95%   BMI 27.20 kg/m  Physical Exam Constitutional:      Comments: Patient is well-nourished well-developed.  He is in severe pain.  Mental status clear.  No respiratory distress at rest but slightly hyperventilating.  HENT:     Mouth/Throat:     Pharynx: Oropharynx is clear.  Eyes:     Extraocular Movements: Extraocular movements intact.  Cardiovascular:     Rate and Rhythm:  Regular rhythm. Tachycardia present.  Pulmonary:     Effort: Pulmonary effort is normal.     Breath sounds: Normal breath sounds.  Abdominal:     Comments: Lower abdomen is distended with firm palpable bladder.  Tender to palpation.  Genitourinary:    Comments: Penis normal uncircumcised. Musculoskeletal:        General: No swelling. Normal range of motion.     Right lower leg: No edema.     Left lower leg: No edema.  Skin:    General: Skin is warm and dry.  Neurological:     General: No focal deficit present.     Mental Status: He is oriented to person, place, and time.     Coordination: Coordination normal.     ED Results / Procedures / Treatments   Labs (all labs ordered are listed, but only abnormal results are displayed) Labs Reviewed  URINALYSIS, ROUTINE W REFLEX MICROSCOPIC - Abnormal; Notable for the following components:      Result Value   Color, Urine AMBER (*)    APPearance HAZY (*)    Hgb urine dipstick SMALL (*)    Protein, ur 100 (*)    Nitrite POSITIVE (*)    Leukocytes,Ua  MODERATE (*)    Bacteria, UA RARE (*)    All other components within normal limits  BASIC METABOLIC PANEL - Abnormal; Notable for the following components:   BUN 21 (*)    Calcium 8.8 (*)    All other components within normal limits  CBC WITH DIFFERENTIAL/PLATELET - Abnormal; Notable for the following components:   WBC 15.1 (*)    Neutro Abs 14.4 (*)    Lymphs Abs 0.3 (*)    All other components within normal limits  CULTURE, BLOOD (ROUTINE X 2)  CULTURE, BLOOD (ROUTINE X 2)  LACTIC ACID, PLASMA  LACTIC ACID, PLASMA    EKG None  Radiology CT Renal Stone Study  Result Date: 05/19/2022 CLINICAL DATA:  Urinary bladder neck obstruction. Complains of kidney stones. EXAM: CT ABDOMEN AND PELVIS WITHOUT CONTRAST TECHNIQUE: Multidetector CT imaging of the abdomen and pelvis was performed following the standard protocol without IV contrast. RADIATION DOSE REDUCTION: This exam was  performed according to the departmental dose-optimization program which includes automated exposure control, adjustment of the mA and/or kV according to patient size and/or use of iterative reconstruction technique. COMPARISON:  None Available. FINDINGS: Lower chest: No acute abnormality.  Bibasilar dependent atelectasis. Hepatobiliary: No focal liver abnormality is seen. No gallstones, gallbladder wall thickening, or biliary dilatation. Pancreas: Unremarkable. No pancreatic ductal dilatation or surrounding inflammatory changes. Spleen: Normal in size without focal abnormality. Adrenals/Urinary Tract: Adrenal glands are unremarkable. The urinary bladder is markedly distended. There is a 0.5 x 1.2 x 1.8 cm calculus at the urinary bladder outlet. There is mild-to-moderate bilateral hydronephrosis. There is also 2-3 mm calculus in the penile urethra. No appreciable renal or ureteral calculus. No appreciable renal mass. Stomach/Bowel: Stomach is within normal limits. Appendix appears normal. No evidence of bowel wall thickening, distention, or inflammatory changes. Vascular/Lymphatic: No significant vascular findings are present. No enlarged abdominal or pelvic lymph nodes. Reproductive: Prostate is unremarkable. Other: No abdominal wall hernia or abnormality. No abdominopelvic ascites. Musculoskeletal: No acute or significant osseous findings. IMPRESSION: 1. Markedly distended urinary bladder with a 0.5 x 1.2 x 1.8 cm calculus at the urinary bladder outlet. There is mild-to-moderate bilateral hydronephrosis. Urology consultation for further management is suggested. 2. There is also a 2-3 mm calculus in the penile urethra. 3. No appreciable renal or ureteral calculus. Electronically Signed   By: Keane Police D.O.   On: 05/19/2022 11:06    Procedures Procedures   CRITICAL CARE Performed by: Charlesetta Shanks   Total critical care time: 30 minutes  Critical care time was exclusive of separately billable procedures  and treating other patients.  Critical care was necessary to treat or prevent imminent or life-threatening deterioration.  Critical care was time spent personally by me on the following activities: development of treatment plan with patient and/or surrogate as well as nursing, discussions with consultants, evaluation of patient's response to treatment, examination of patient, obtaining history from patient or surrogate, ordering and performing treatments and interventions, ordering and review of laboratory studies, ordering and review of radiographic studies, pulse oximetry and re-evaluation of patient's condition.  Medications Ordered in ED Medications  lactated ringers infusion ( Intravenous New Bag/Given 05/19/22 1426)  HYDROmorphone (DILAUDID) injection 1 mg (1 mg Intravenous Given 05/19/22 1000)  HYDROmorphone (DILAUDID) injection 1 mg (1 mg Intravenous Given 05/19/22 1215)  cefTRIAXone (ROCEPHIN) 2 g in sodium chloride 0.9 % 100 mL IVPB (0 g Intravenous Stopped 05/19/22 1425)  chlorhexidine (PERIDEX) 0.12 % solution 15 mL (15 mLs Mouth/Throat Given 05/19/22  1426)    ED Course/ Medical Decision Making/ A&P                           Medical Decision Making Amount and/or Complexity of Data Reviewed Labs: ordered. Radiology: ordered.  Risk Prescription drug management.   Patient presents with severe suprapubic pain.  He reports history of prostatitis and being told he had prostate enlargement but no specific reason why an otherwise healthy 31 year old.  Patient reports he has been treated in the past with antibiotics for prostate and urine infection but denies other specific medical problems.  Patient is extremely uncomfortable.  I did bring bedside ultrasound in the room.  Bladder is full and distended.  On exam patient has a pad in place with some urinary overflow.  Procedure: At bedside with Betadine prep and sterile glove I made 2 attempts at placement of coud catheter.  First a  65 French catheter then a 14 [place] coud catheter.  Resistance was met.  I tried repositioning the catheter and with several times unable to pass against an obstruction.  Consult: Dr. Gloriann Loan consulted for urinary retention unable to pass catheter.  Dr. Gloriann Loan requested urology cart at bedside.  CT scan reviewed by radiology and directly reviewed by myself as well.  Extreme bladder distention and lithiasis in the urethra.  Significant hydronephrosis bilaterally.  Patient's urine returned with nitrite and white cells and red cells.  Patient has leukocytosis 15.1.  Leukocytosis and urinary retention will administer Rocephin 2 g IV.  Patient heart rate and blood pressure elevating significantly when he is in acute pain with bladder spasms.  Pain controlled with Dilaudid, heart rate is down to low 100s and blood pressures are normotensive.  Patient is afebrile.  At this time patient does not have clinical sepsis and lactate is 1.2.  At this time will not opt for fluid bolusing.  I performed multiple rechecks on the patient for pain control and response to treatment.  Patient was taken to the OR by Dr. Gloriann Loan.        Final Clinical Impression(s) / ED Diagnoses Final diagnoses:  Urinary retention  Acute cystitis with hematuria  Urethral stone  Other hydronephrosis    Rx / DC Orders ED Discharge Orders     None         Charlesetta Shanks, MD 05/19/22 1517

## 2022-05-19 NOTE — Transfer of Care (Signed)
Immediate Anesthesia Transfer of Care Note  Patient: Johnny Burch  Procedure(s) Performed: Procedure(s): CYSTOSCOPY WITH URETHRAL DILATATION (N/A)  Patient Location: PACU  Anesthesia Type:General  Level of Consciousness: Alert, Awake, Oriented  Airway & Oxygen Therapy: Patient Spontanous Breathing  Post-op Assessment: Report given to RN  Post vital signs: Reviewed and stable  Last Vitals:  Vitals:   05/19/22 1355 05/19/22 1425  BP:  127/80  Pulse:  (!) 106  Resp:  18  Temp: 36.9 C 36.7 C  SpO2:  97%    Complications: No apparent anesthesia complications

## 2022-05-19 NOTE — Discharge Instructions (Addendum)
May have some blood in the urine.  You also may have some bleeding around the catheter at the tip of the penis.  This is okay as long as her catheter is draining well.  You will be called to schedule an appointment for catheter removal in approximately 1 week.  You will ultimately need referral to a reconstructive urologist after that at [facility].

## 2022-05-20 ENCOUNTER — Encounter (HOSPITAL_COMMUNITY): Payer: Self-pay | Admitting: Urology

## 2022-05-22 LAB — CULTURE, BLOOD (ROUTINE X 2): Special Requests: ADEQUATE

## 2022-05-22 NOTE — ED Notes (Signed)
Lab called and provided that the patients blood cultures (all 4 bottles) were gram positive rod diphtheroids. Dr. Gilford Raid notified in ED. No new orders.

## 2022-05-23 LAB — CULTURE, BLOOD (ROUTINE X 2): Special Requests: ADEQUATE

## 2022-05-26 DIAGNOSIS — R3914 Feeling of incomplete bladder emptying: Secondary | ICD-10-CM | POA: Diagnosis not present

## 2023-01-12 IMAGING — US US SCROTUM W/ DOPPLER COMPLETE
1 series · 15 of 25 positions shown · non-contrast
Comparison: None.

CLINICAL DATA: Acute left-sided scrotal pain.

EXAM:
SCROTAL ULTRASOUND
DOPPLER ULTRASOUND OF THE TESTICLES
TECHNIQUE: Complete ultrasound examination of the testicles, epididymis, and
other scrotal structures was performed. Color and spectral Doppler
ultrasound were also utilized to evaluate blood flow to the
testicles.

[Series 1: us art/ven flow abd pelv doppl mc & wl · 15 of 69 slices shown]
[im 1/69]
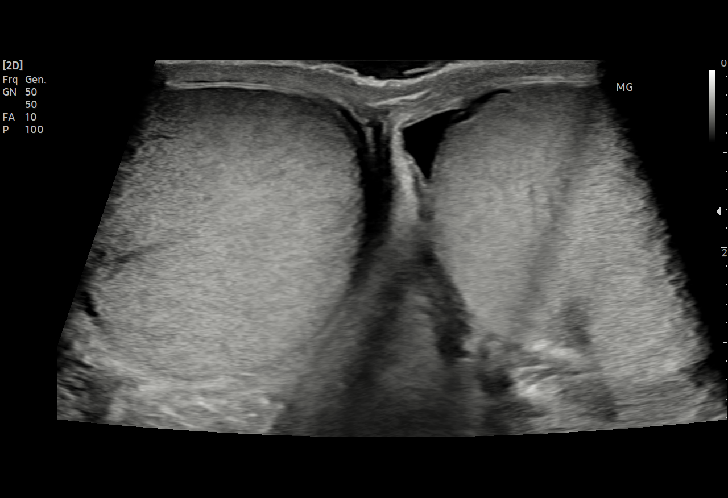
[im 6/69]
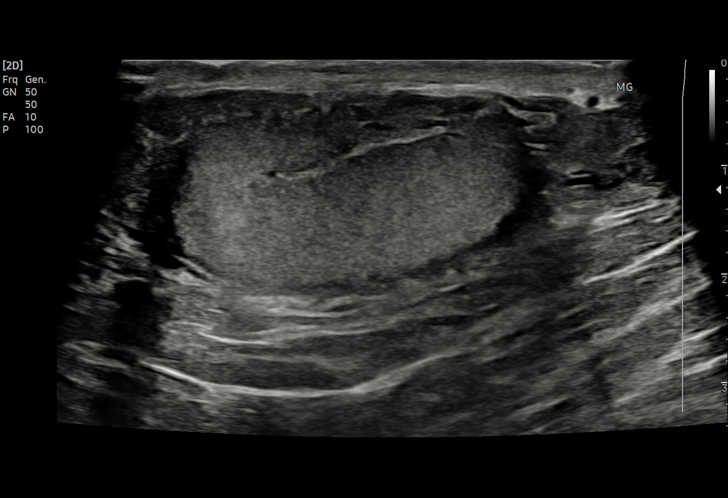
[im 12/69]
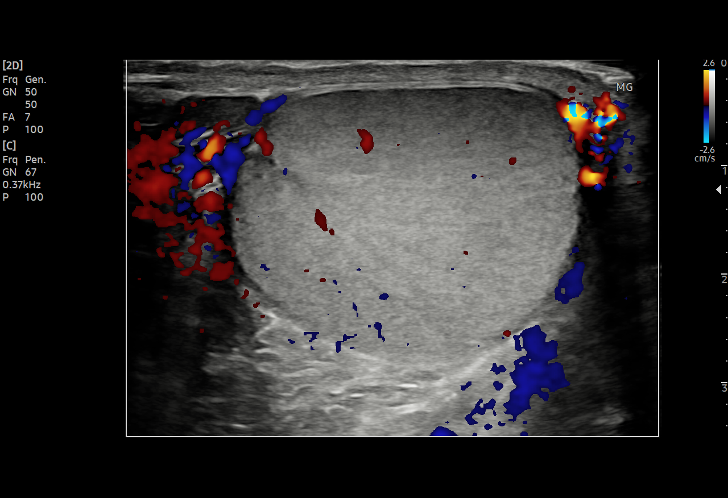
[im 15/69]
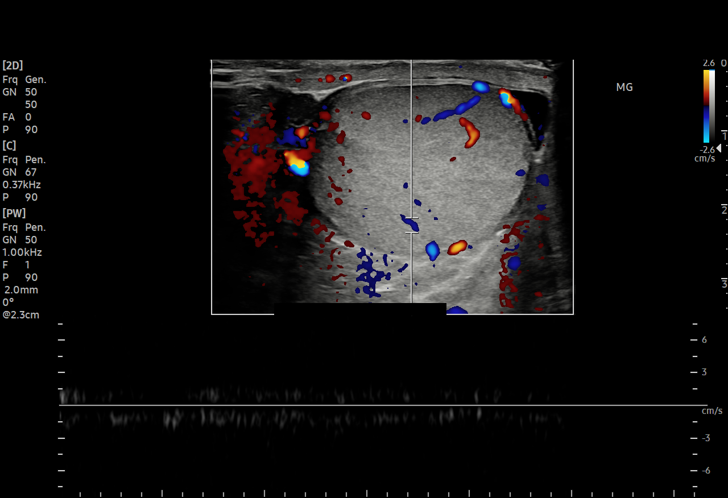
[im 20/69]
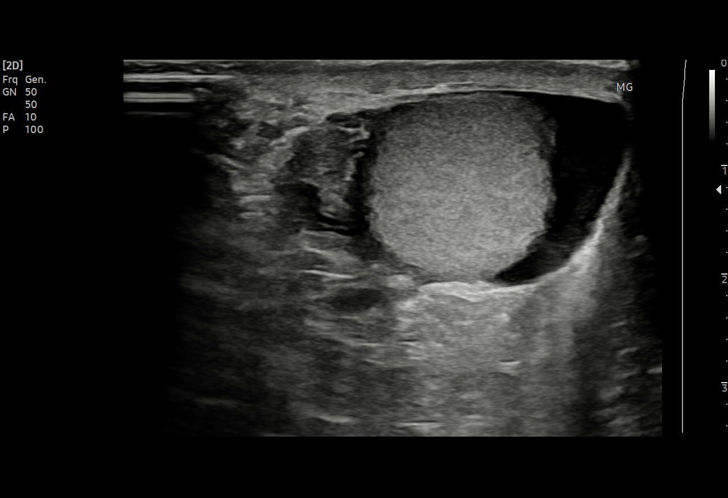
[im 26/69]
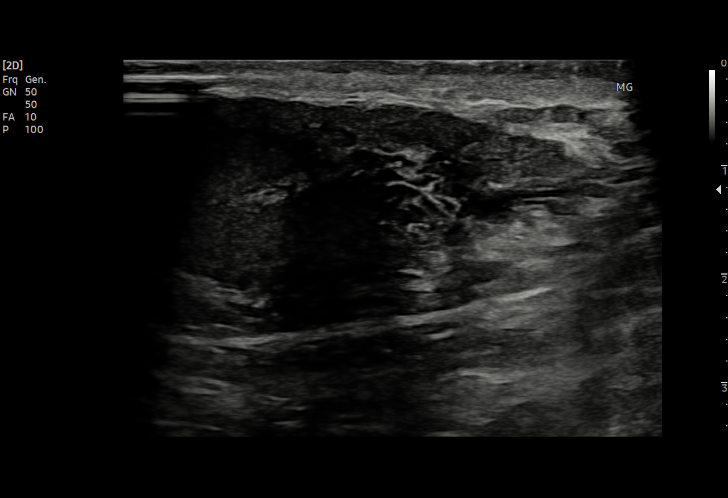
[im 29/69]
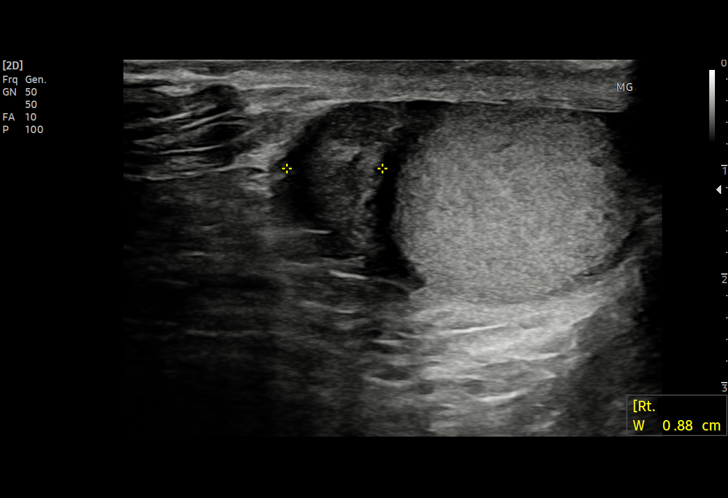
[im 35/69]
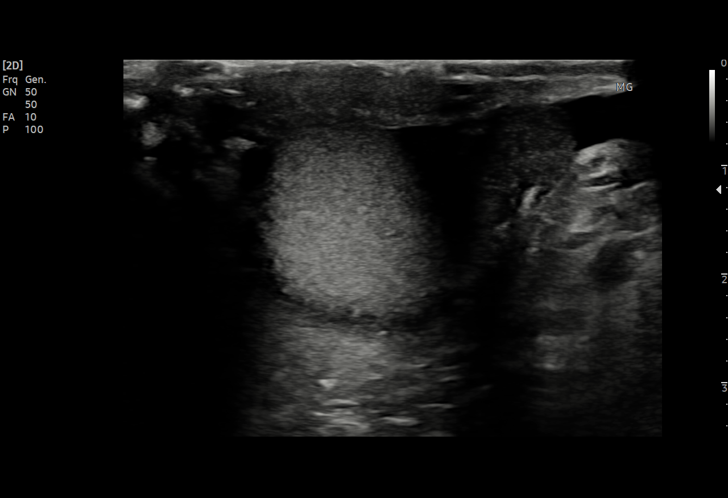
[im 40/69]
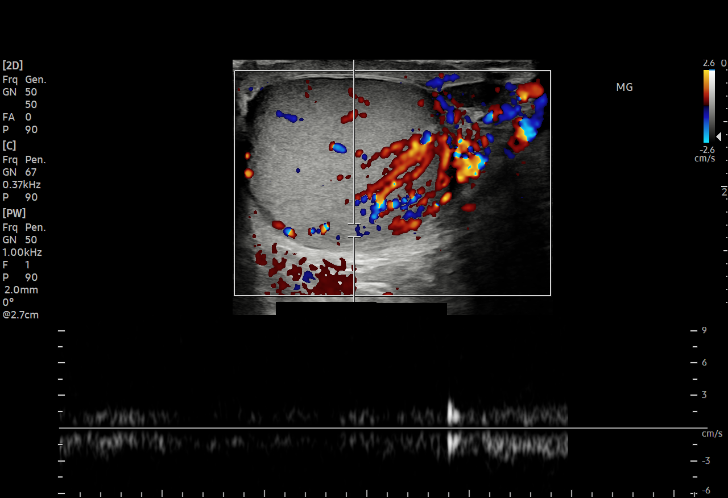
[im 43/69]
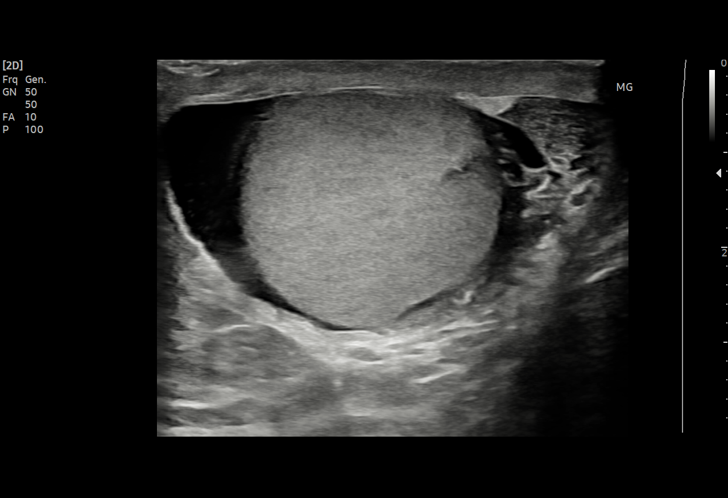
[im 49/69]
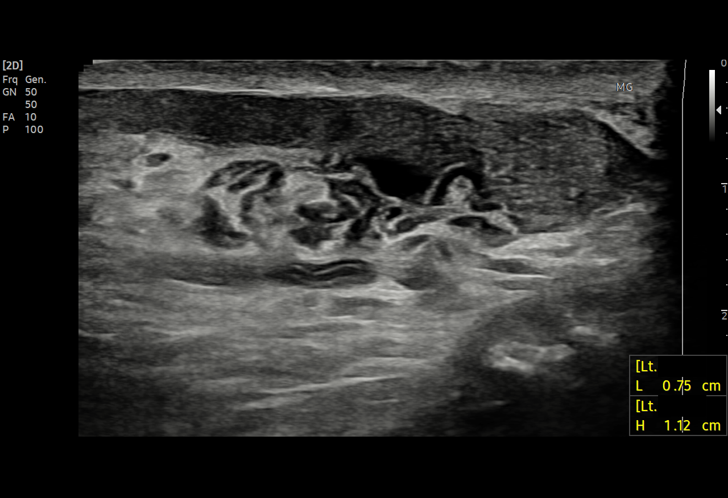
[im 54/69]
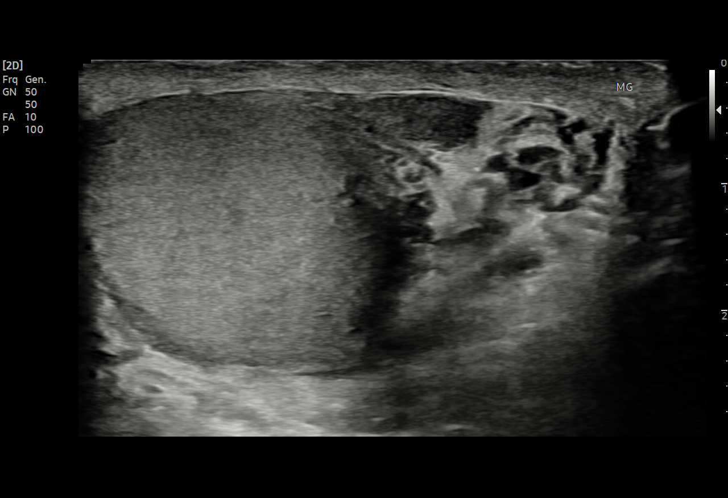
[im 57/69]
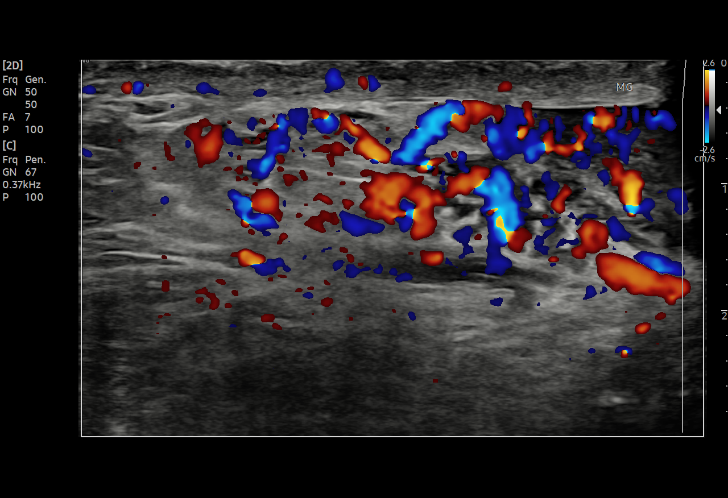
[im 63/69]
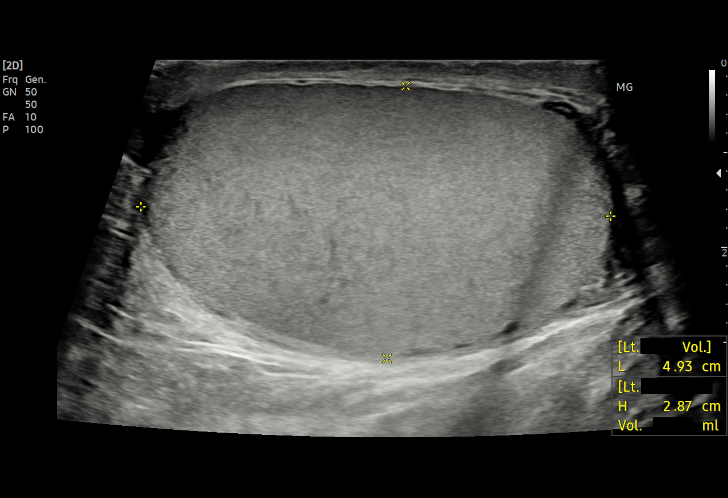
[im 69/69]
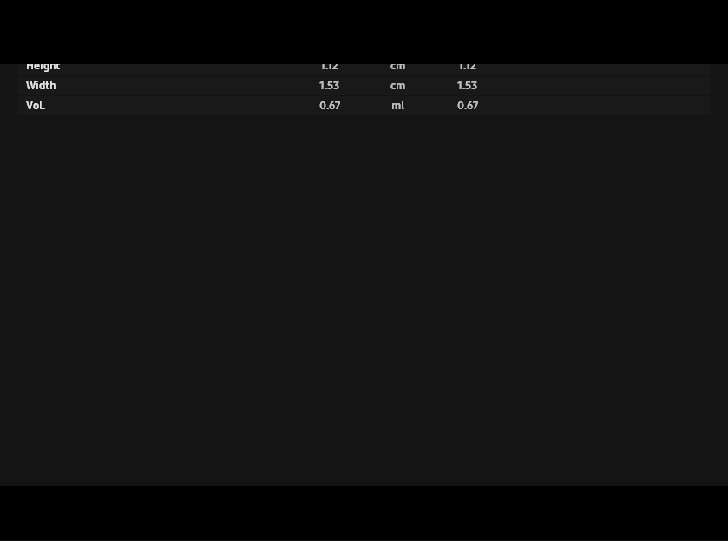

[15 of 25 positions shown; findings below may reference images not displayed]

FINDINGS: Right testicle

Measurements: 4.7 x 3.3 x 2.4 cm. No mass or microlithiasis
visualized.

Left testicle

Measurements: 4.9 x 3.3 x 2.9 cm. No mass or microlithiasis
visualized. The testicle is somewhat hypervascular on Doppler.

Right epididymis:  Normal in size and appearance.

Left epididymis:  Hypervascular suggesting epididymitis.

Hydrocele:  Small bilateral hydroceles are noted.

Varicocele:  None visualized.

Pulsed Doppler interrogation of both testes demonstrates normal low
resistance arterial and venous waveforms bilaterally.
IMPRESSION: No evidence of testicular mass or torsion.

Left testicle and epididymis are hypervascular suggesting left-sided
epididymo-orchitis.

Small bilateral hydroceles.

## 2023-03-17 ENCOUNTER — Emergency Department (HOSPITAL_COMMUNITY): Payer: Federal, State, Local not specified - PPO

## 2023-03-17 ENCOUNTER — Emergency Department (HOSPITAL_COMMUNITY)
Admission: EM | Admit: 2023-03-17 | Discharge: 2023-03-18 | Disposition: A | Discharge status: Home / Self Care | Payer: Federal, State, Local not specified - PPO | Attending: Emergency Medicine | Admitting: Emergency Medicine

## 2023-03-17 ENCOUNTER — Other Ambulatory Visit: Payer: Self-pay

## 2023-03-17 ENCOUNTER — Encounter (HOSPITAL_COMMUNITY): Payer: Self-pay | Admitting: Emergency Medicine

## 2023-03-17 DIAGNOSIS — R110 Nausea: Secondary | ICD-10-CM | POA: Diagnosis not present

## 2023-03-17 DIAGNOSIS — R1084 Generalized abdominal pain: Secondary | ICD-10-CM | POA: Insufficient documentation

## 2023-03-17 DIAGNOSIS — R319 Hematuria, unspecified: Secondary | ICD-10-CM | POA: Insufficient documentation

## 2023-03-17 LAB — URINALYSIS, ROUTINE W REFLEX MICROSCOPIC
Bacteria, UA: NONE SEEN
Bilirubin Urine: NEGATIVE
Glucose, UA: NEGATIVE mg/dL
Ketones, ur: NEGATIVE mg/dL
Leukocytes,Ua: NEGATIVE
Nitrite: NEGATIVE
Protein, ur: NEGATIVE mg/dL
Specific Gravity, Urine: 1.004 — ABNORMAL LOW (ref 1.005–1.030)
pH: 7 (ref 5.0–8.0)

## 2023-03-17 NOTE — ED Provider Notes (Signed)
[facility]ERG[facility] DEPARTMENT AT [facility] Provider Note   CSN: [id number] Arrival date & time: 03/17/23  2152     History  Chief Complaint  Patient presents with   Abdominal Pain    Johnny Burch is a 32 y.o. male.  Patient with past medical history significant for cystoscopy with urethral dilation secondary to bladder stone in October of last year presents to the emergency department complaining of generalized abdominal pain radiating across his abdomen which began approximately 4 hours prior to my assessment.  He endorsed mild nausea with no emesis, regular bowel movement since onset, bloating.  He states that for lunch today he ate pizza and a bacon cheeseburger.  He took a Gas-X prior to coming to the emergency department.  At the time of my assessment he states his pain is very minimal and he was sleeping as I entered the room.  Currently the patient states he feels much less "bloated" than he did at the time of arrival.  He denies chest pain, shortness of breath, urinary symptoms.  He does state that he was told after the procedure that he may have small onset hematuria for the next few years and that hematuria is not an abnormal result for him.  HPI     Home Medications Prior to Admission medications   Medication Sig Start Date End Date Taking? Authorizing Provider  ibuprofen (ADVIL) 200 MG tablet Take 200-600 mg by mouth every 6 (six) hours as needed for mild pain.    [provider]  Multiple Vitamin (MULTIVITAMIN) capsule Take 1 capsule by mouth daily.    [provider]  oxyCODONE (OXY IR/ROXICODONE) 5 MG immediate release tablet Take 1 tablet (5 mg total) by mouth once as needed (for pain score of 1-4). 05/19/22   Crista Elliot, MD      Allergies    Patient has no known allergies.    Review of Systems   Review of Systems  Physical Exam Updated Vital Signs BP (!) 162/107   Pulse (!) 110   Temp 98.7 F (37.1 C) (Oral)    Resp 20   SpO2 100%  Physical Exam Vitals and nursing note reviewed.  Constitutional:      General: He is not in acute distress.    Appearance: He is well-developed.  HENT:     Head: Normocephalic and atraumatic.  Eyes:     Conjunctiva/sclera: Conjunctivae normal.  Cardiovascular:     Rate and Rhythm: Normal rate and regular rhythm.     Heart sounds: No murmur heard. Pulmonary:     Effort: Pulmonary effort is normal. No respiratory distress.     Breath sounds: Normal breath sounds.  Abdominal:     General: Bowel sounds are normal. There is distension (Minimal distention).     Palpations: Abdomen is soft.     Tenderness: There is no abdominal tenderness (No abdominal tenderness on exam.).  Musculoskeletal:        General: No swelling.     Cervical back: Neck supple.  Skin:    General: Skin is warm and dry.     Capillary Refill: Capillary refill takes less than 2 seconds.  Neurological:     Mental Status: He is alert.  Psychiatric:        Mood and Affect: Mood normal.     ED Results / Procedures / Treatments   Labs (all labs ordered are listed, but only abnormal results are displayed) Labs Reviewed  URINALYSIS,  ROUTINE W REFLEX MICROSCOPIC - Abnormal; Notable for the following components:      Result Value   Color, Urine STRAW (*)    Specific Gravity, Urine 1.004 (*)    Hgb urine dipstick SMALL (*)    All other components within normal limits    EKG None  Radiology DG Abd 1 View  Result Date: 03/17/2023 CLINICAL DATA:  Abdominal pain. EXAM: ABDOMEN - 1 VIEW COMPARISON:  None Available. FINDINGS: The bowel gas pattern is normal. A moderate amount of stool is seen along the hepatic flexure. No radio-opaque calculi or other significant radiographic abnormality are seen. IMPRESSION: Negative. Electronically Signed   By: Aram Candela M.D.   On: 03/17/2023 23:05    Procedures Procedures    Medications Ordered in ED Medications - No data to display  ED Course/  Medical Decision Making/ A&P                                 Medical Decision Making Amount and/or Complexity of Data Reviewed Labs: ordered. Radiology: ordered.   This patient presents to the ED for concern of abdominal pain, this involves an extensive number of treatment options, and is a complaint that carries with it a high risk of complications and morbidity.  The differential diagnosis includes gastritis, GERD, gastroenteritis, cholecystitis, appendicitis, pancreatitis, others   Co morbidities that complicate the patient evaluation  History of bladder stone, orchitis, epididymitis   Additional history obtained:   External records from outside source obtained and reviewed including notes from hospitalization from October of last year   Lab Tests:  I Ordered, and personally interpreted labs.  The pertinent results include: UA with small hemoglobin on dipstick   Imaging Studies ordered:  I ordered imaging studies including plain films of the abdomen I independently visualized and interpreted imaging which showed normal bowel gas pattern, negative abdominal x-ray I agree with the radiologist interpretation   Test / Admission - Considered:  Patient feeling much better at the time of my assessment.  I discussed doing a full abdominal pain workup including labs up to a CT scan of his abdomen and the patient states he feels all that would be unnecessary at this time as his symptoms have subsided.  His complaint seem most consistent with acute gas and he does endorse improvement of symptoms after belching.  There is no focal tenderness to necessitate advanced abdominal imaging at this time.  Based on the patient's improvement in symptoms I feel it is reasonable to not initiate blood work at this time.  Patient has been provided with strict return precautions including worsening/sharp abdominal pain.  He voices understanding with return precautions.  No indication at this time for  admission.  Discharge home.         Final Clinical Impression(s) / ED Diagnoses Final diagnoses:  Generalized abdominal pain    Rx / DC Orders ED Discharge Orders     None         Pamala Duffel 03/17/23 2359    Tilden Fossa, MD 03/18/23 2251

## 2023-03-17 NOTE — ED Triage Notes (Signed)
Pt reports central abd pain that radiates to both sides abd pain x 3 hours. Pt reports regular bowel movements. Pt reports bloating.

## 2023-03-17 NOTE — Discharge Instructions (Signed)
You were evaluated today for abdominal pain.  We discussed further lab work and imaging but you declined at this time which is reasonable.  Your symptoms may be due to to diet/gas or due to excess acid.  If you develop life-threatening symptoms such as sudden, sharp abdominal pain please return to the emergency department.  Otherwise, please follow-up as needed with your primary care provider.

## 2023-04-23 IMAGING — US US SCROTUM W/ DOPPLER COMPLETE
1 series · 14 of 25 positions shown · non-contrast
Comparison: None Available.

CLINICAL DATA: Testicular pain chronic, worsening recently

EXAM:
SCROTAL ULTRASOUND
DOPPLER ULTRASOUND OF THE TESTICLES
TECHNIQUE: Complete ultrasound examination of the testicles, epididymis, and
other scrotal structures was performed. Color and spectral Doppler
ultrasound were also utilized to evaluate blood flow to the
testicles.

[Series 1: us scrotum w/doppler · 14 of 80 slices shown]
[im 1/80]
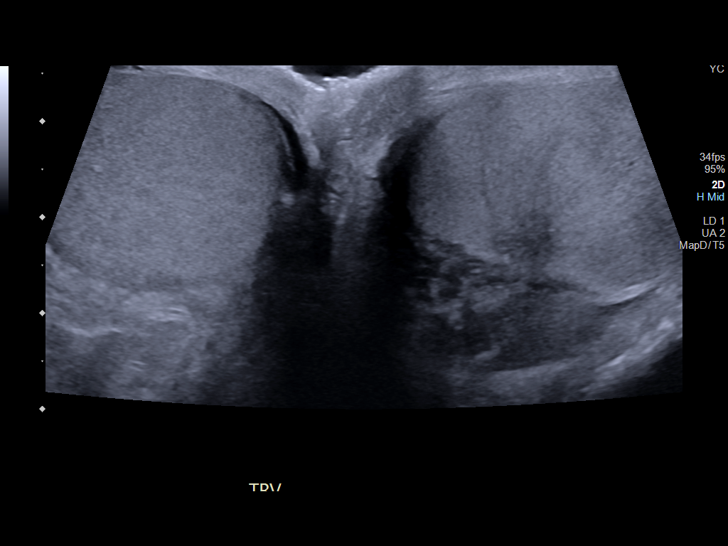
[im 7/80]
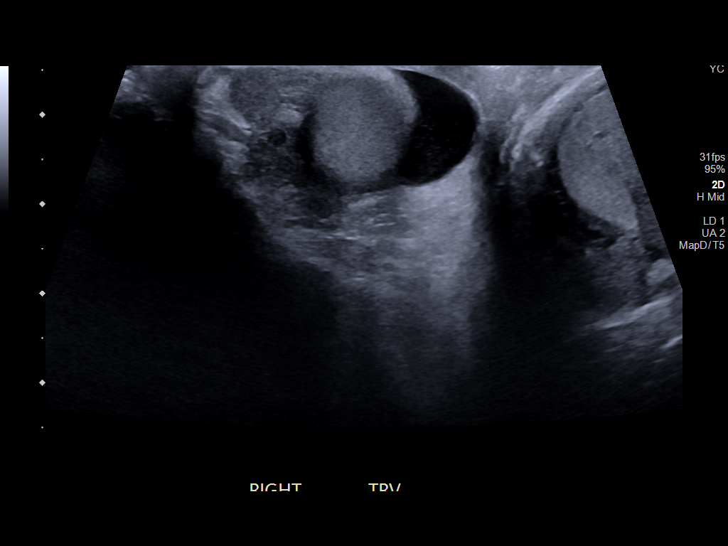
[im 14/80]
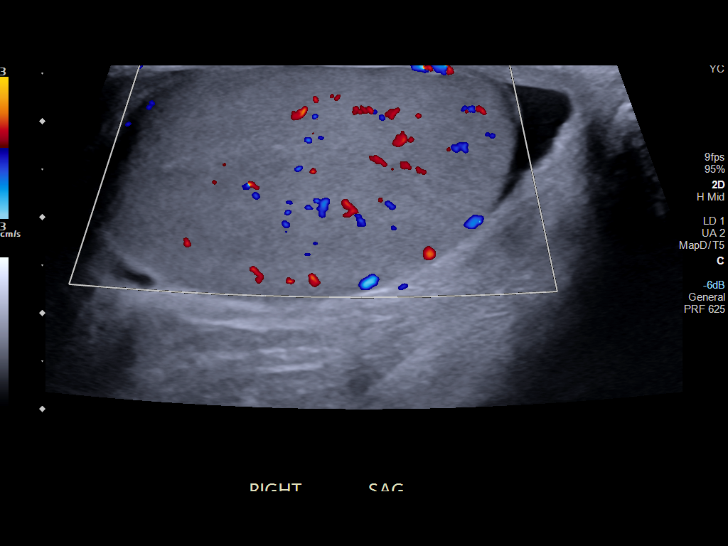
[im 20/80]
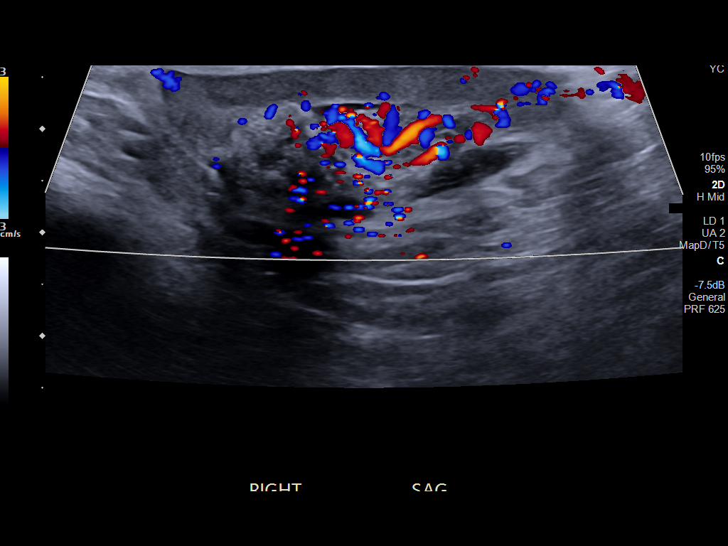
[im 27/80]
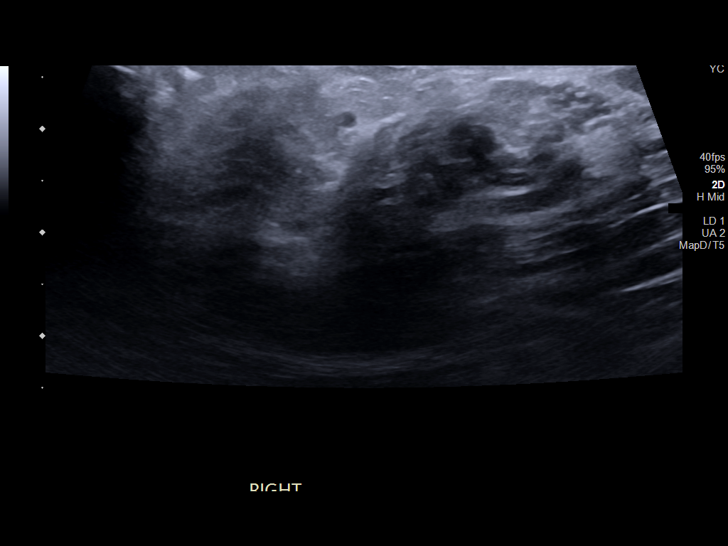
[im 30/80]
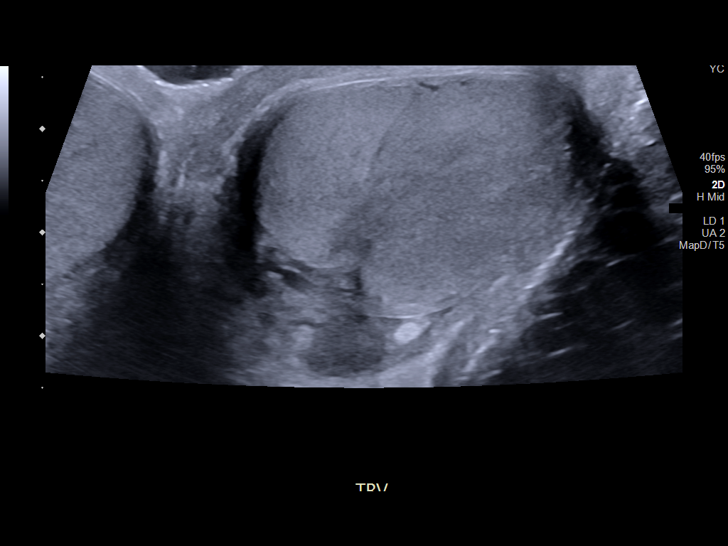
[im 37/80]
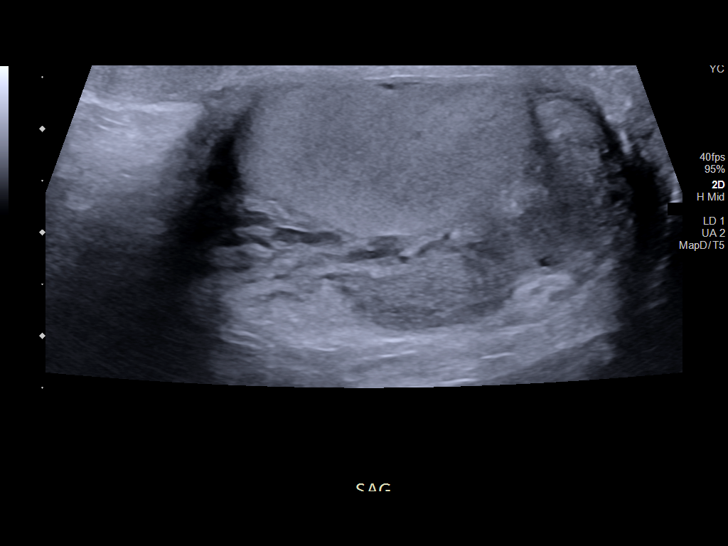
[im 43/80]
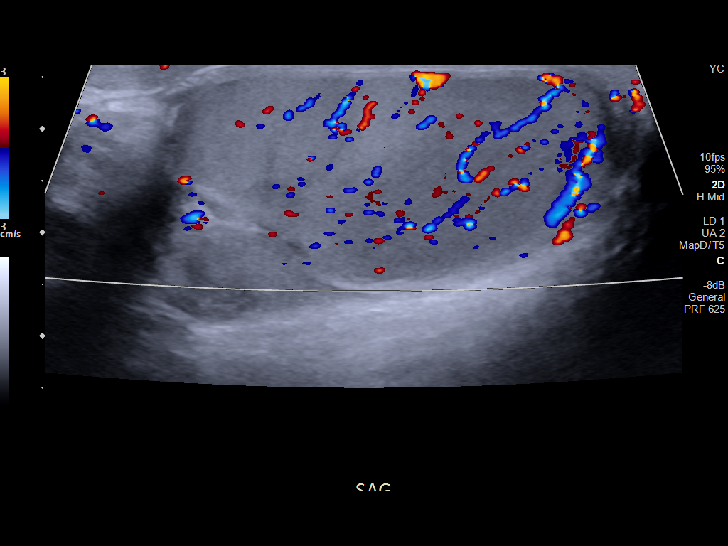
[im 50/80]
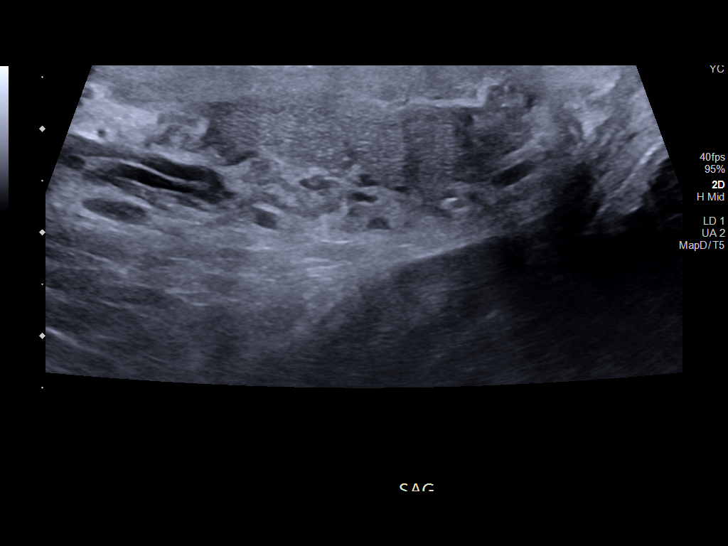
[im 53/80]
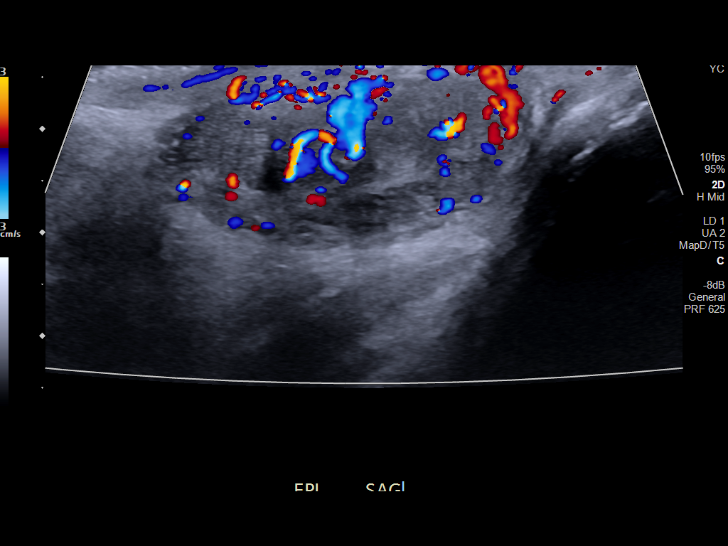
[im 60/80]
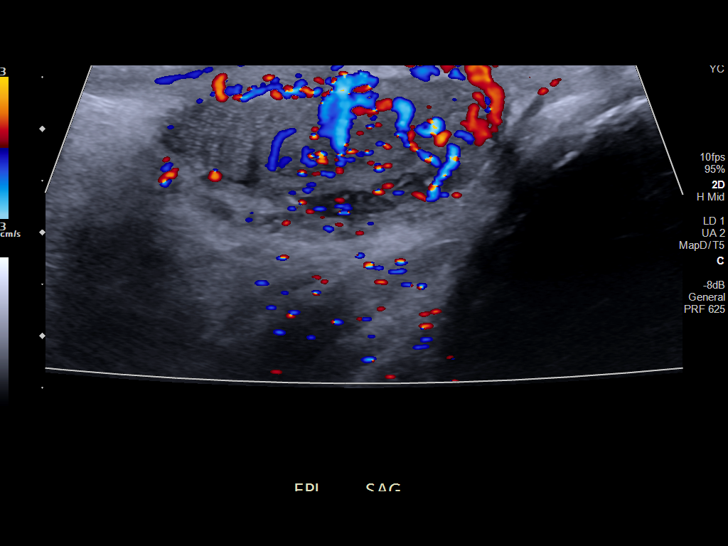
[im 66/80]
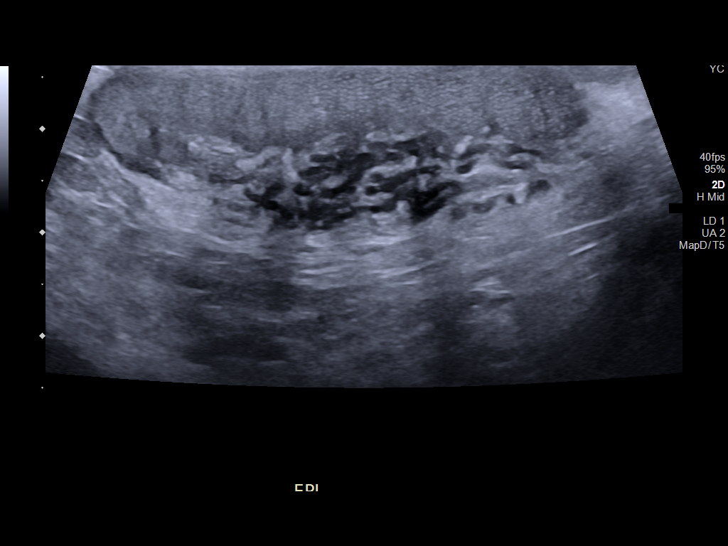
[im 73/80]
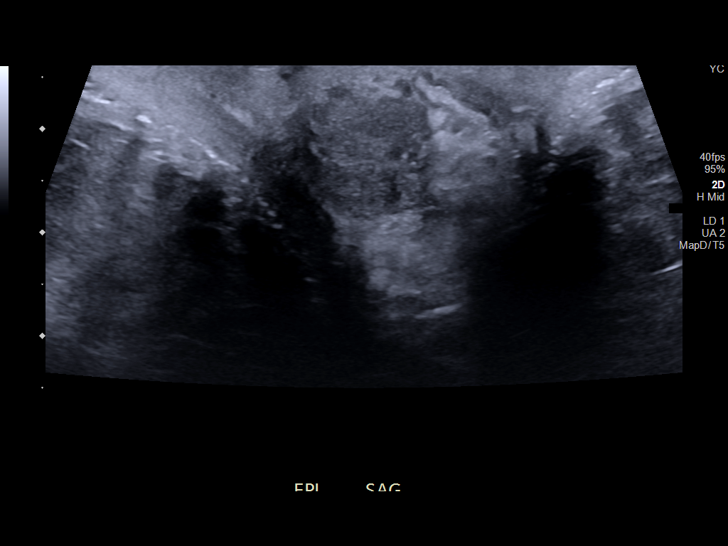
[im 80/80]
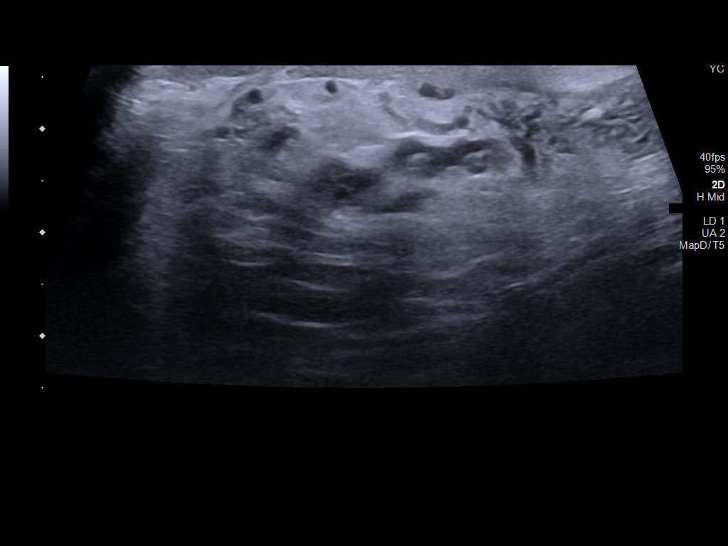

[14 of 25 positions shown; findings below may reference images not displayed]

FINDINGS: Right testicle

Measurements: 4.3 x 2.6 x 3.4 cm. No mass or microlithiasis
visualized.

Left testicle

Measurements: 4.2 x 2.1 x 3.1 cm. Heterogeneous echotexture with
hypervascular blood flow

Right epididymis:  Normal in size and appearance.

Left epididymis:  Enlarged, hypervascular

Hydrocele:  None visualized.

Varicocele:  None visualized.

Pulsed Doppler interrogation of both testes demonstrates normal low
resistance arterial and venous waveforms bilaterally.
IMPRESSION: Enlarged left epididymis. Hyperemia to the left testicle and
epididymis compatible with epididymo-orchitis.
